# Patient Record
Sex: Male | Born: 1970 | Race: Black or African American | Hispanic: No | Marital: Single | Smoking: Never smoker
Health system: Southern US, Community
[De-identification: ages and names within clinical notes are randomized; demographics above are authoritative.]

## PROBLEM LIST (undated history)

## (undated) DIAGNOSIS — R569 Unspecified convulsions: Secondary | ICD-10-CM

## (undated) DIAGNOSIS — G40909 Epilepsy, unspecified, not intractable, without status epilepticus: Secondary | ICD-10-CM

---

## 2006-07-19 ENCOUNTER — Emergency Department (HOSPITAL_COMMUNITY): Admission: EM | Admit: 2006-07-19 | Discharge: 2006-07-19 | Payer: Self-pay | Admitting: Emergency Medicine

## 2006-12-11 ENCOUNTER — Ambulatory Visit: Payer: Self-pay | Admitting: *Deleted

## 2006-12-11 ENCOUNTER — Ambulatory Visit: Payer: Self-pay | Admitting: Internal Medicine

## 2007-05-11 ENCOUNTER — Emergency Department (HOSPITAL_COMMUNITY): Admission: EM | Admit: 2007-05-11 | Discharge: 2007-05-11 | Payer: Self-pay | Admitting: Emergency Medicine

## 2007-10-03 ENCOUNTER — Encounter (INDEPENDENT_AMBULATORY_CARE_PROVIDER_SITE_OTHER): Payer: Self-pay | Admitting: Nurse Practitioner

## 2007-10-03 ENCOUNTER — Ambulatory Visit: Payer: Self-pay | Admitting: Internal Medicine

## 2007-10-03 LAB — CONVERTED CEMR LAB
Eosinophils Relative: 5 % (ref 0–5)
Glucose, Bld: 86 mg/dL (ref 70–99)
Lymphocytes Relative: 36 % (ref 12–46)
Lymphs Abs: 2.1 10*3/uL (ref 0.7–4.0)
MCHC: 33 g/dL (ref 30.0–36.0)
MCV: 90.9 fL (ref 78.0–100.0)
Monocytes Absolute: 0.9 10*3/uL (ref 0.1–1.0)
Monocytes Relative: 15 % — ABNORMAL HIGH (ref 3–12)
Neutro Abs: 2.6 10*3/uL (ref 1.7–7.7)
Potassium: 4.4 meq/L (ref 3.5–5.3)
RBC: 4.7 M/uL (ref 4.22–5.81)
RDW: 13.7 % (ref 11.5–15.5)
Sodium: 139 meq/L (ref 135–145)
TSH: 0.729 microintl units/mL (ref 0.350–5.50)
Total Protein: 7.4 g/dL (ref 6.0–8.3)
WBC: 6 10*3/uL (ref 4.0–10.5)

## 2007-10-06 ENCOUNTER — Encounter (INDEPENDENT_AMBULATORY_CARE_PROVIDER_SITE_OTHER): Payer: Self-pay | Admitting: Nurse Practitioner

## 2007-10-06 LAB — CONVERTED CEMR LAB
Hep A Total Ab: NEGATIVE
Hep B E Ab: NEGATIVE

## 2008-03-01 ENCOUNTER — Ambulatory Visit: Payer: Self-pay | Admitting: Internal Medicine

## 2008-03-01 LAB — CONVERTED CEMR LAB
AST: 78 units/L — ABNORMAL HIGH (ref 0–37)
Albumin: 4.4 g/dL (ref 3.5–5.2)
BUN: 15 mg/dL (ref 6–23)
CO2: 24 meq/L (ref 19–32)
Calcium: 9.5 mg/dL (ref 8.4–10.5)
Carbamazepine Lvl: 3.7 ug/mL — ABNORMAL LOW (ref 4.0–12.0)
Creatinine, Ser: 1.6 mg/dL — ABNORMAL HIGH (ref 0.40–1.50)
Marijuana Metabolite: NEGATIVE
Opiate Screen, Urine: NEGATIVE
Total Bilirubin: 0.7 mg/dL (ref 0.3–1.2)

## 2008-03-25 ENCOUNTER — Ambulatory Visit (HOSPITAL_COMMUNITY): Admission: RE | Admit: 2008-03-25 | Discharge: 2008-03-25 | Payer: Self-pay | Admitting: Internal Medicine

## 2008-07-26 ENCOUNTER — Emergency Department (HOSPITAL_COMMUNITY): Admission: EM | Admit: 2008-07-26 | Discharge: 2008-07-26 | Payer: Self-pay | Admitting: Emergency Medicine

## 2008-10-15 ENCOUNTER — Ambulatory Visit: Payer: Self-pay | Admitting: Family Medicine

## 2008-11-15 ENCOUNTER — Emergency Department (HOSPITAL_COMMUNITY): Admission: EM | Admit: 2008-11-15 | Discharge: 2008-11-15 | Payer: Self-pay | Admitting: Emergency Medicine

## 2009-06-02 ENCOUNTER — Ambulatory Visit: Payer: Self-pay | Admitting: Internal Medicine

## 2009-06-02 LAB — CONVERTED CEMR LAB
BUN: 13 mg/dL (ref 6–23)
Basophils Absolute: 0 10*3/uL (ref 0.0–0.1)
CO2: 25 meq/L (ref 19–32)
Chloride: 102 meq/L (ref 96–112)
Creatinine, Ser: 1.37 mg/dL (ref 0.40–1.50)
Glucose, Bld: 88 mg/dL (ref 70–99)
Hemoglobin: 14.7 g/dL (ref 13.0–17.0)
Lymphocytes Relative: 33 % (ref 12–46)
Lymphs Abs: 1.9 10*3/uL (ref 0.7–4.0)
MCHC: 33.8 g/dL (ref 30.0–36.0)
MCV: 90.1 fL (ref 78.0–100.0)
Neutrophils Relative %: 49 % (ref 43–77)
Platelets: 274 10*3/uL (ref 150–400)
Triglycerides: 48 mg/dL (ref <150)
WBC: 5.7 10*3/uL (ref 4.0–10.5)

## 2009-06-09 ENCOUNTER — Encounter (INDEPENDENT_AMBULATORY_CARE_PROVIDER_SITE_OTHER): Payer: Self-pay | Admitting: *Deleted

## 2010-06-27 NOTE — Letter (Signed)
Summary: *HSN Results Follow up  [facility]  [address]   [place], [place] [address]   Phone: [phone]  Fax: [phone]      06/09/2009   [name] [address] [place], [place]  [address]   Dear  Mr. Jason Flowers,                            ____S.Drinkard,FNP   ____D. Gore,FNP       ____B. McPherson,MD   ____V. Rankins,MD    ____E. Mulberry,MD    ____N. Daphine Deutscher, FNP  __X__D. Reche Dixon, MD    ____K. Philipp Deputy, MD    ____Other     This letter is to inform you that your recent test(s):  _______Pap Smear    ___X____Lab Test     _______X-ray    __X_____ is within acceptable limits  _______ requires a medication change  _______ requires a follow-up lab visit  _______ requires a follow-up visit with your provider   Comments:       _________________________________________________________ If you have any questions, please contact our office                     Sincerely,  Gaylyn Cheers RN [address]

## 2010-07-14 ENCOUNTER — Inpatient Hospital Stay (INDEPENDENT_AMBULATORY_CARE_PROVIDER_SITE_OTHER)
Admission: RE | Admit: 2010-07-14 | Discharge: 2010-07-14 | Disposition: A | Discharge status: Home / Self Care | Payer: Self-pay | Source: Ambulatory Visit | Attending: Family Medicine | Admitting: Family Medicine

## 2010-07-14 ENCOUNTER — Encounter (HOSPITAL_COMMUNITY): Payer: Self-pay | Admitting: Radiology

## 2010-07-14 ENCOUNTER — Emergency Department (HOSPITAL_COMMUNITY): Payer: Self-pay

## 2010-07-14 ENCOUNTER — Emergency Department (HOSPITAL_COMMUNITY)
Admission: EM | Admit: 2010-07-14 | Discharge: 2010-07-14 | Disposition: A | Discharge status: Home / Self Care | Payer: Self-pay | Attending: Emergency Medicine | Admitting: Emergency Medicine

## 2010-07-14 DIAGNOSIS — N289 Disorder of kidney and ureter, unspecified: Secondary | ICD-10-CM | POA: Insufficient documentation

## 2010-07-14 DIAGNOSIS — R404 Transient alteration of awareness: Secondary | ICD-10-CM | POA: Insufficient documentation

## 2010-07-14 DIAGNOSIS — Z79899 Other long term (current) drug therapy: Secondary | ICD-10-CM | POA: Insufficient documentation

## 2010-07-14 DIAGNOSIS — Z9119 Patient's noncompliance with other medical treatment and regimen: Secondary | ICD-10-CM | POA: Insufficient documentation

## 2010-07-14 DIAGNOSIS — R569 Unspecified convulsions: Secondary | ICD-10-CM

## 2010-07-14 DIAGNOSIS — G40909 Epilepsy, unspecified, not intractable, without status epilepticus: Secondary | ICD-10-CM | POA: Insufficient documentation

## 2010-07-14 DIAGNOSIS — F29 Unspecified psychosis not due to a substance or known physiological condition: Secondary | ICD-10-CM | POA: Insufficient documentation

## 2010-07-14 DIAGNOSIS — Z91199 Patient's noncompliance with other medical treatment and regimen due to unspecified reason: Secondary | ICD-10-CM | POA: Insufficient documentation

## 2010-07-14 HISTORY — DX: Epilepsy, unspecified, not intractable, without status epilepticus: G40.909

## 2010-07-14 LAB — URINALYSIS, ROUTINE W REFLEX MICROSCOPIC
Bilirubin Urine: NEGATIVE
Ketones, ur: 15 mg/dL — AB
Nitrite: NEGATIVE
Urine Glucose, Fasting: NEGATIVE mg/dL

## 2010-07-14 LAB — COMPREHENSIVE METABOLIC PANEL
ALT: 21 U/L (ref 0–53)
AST: 36 U/L (ref 0–37)
Albumin: 4 g/dL (ref 3.5–5.2)
Alkaline Phosphatase: 80 U/L (ref 39–117)
Calcium: 9.8 mg/dL (ref 8.4–10.5)
Creatinine, Ser: 1.79 mg/dL — ABNORMAL HIGH (ref 0.4–1.5)
Potassium: 3.8 mEq/L (ref 3.5–5.1)
Total Bilirubin: 0.6 mg/dL (ref 0.3–1.2)

## 2010-07-14 LAB — POCT I-STAT, CHEM 8
BUN: 18 mg/dL (ref 6–23)
Chloride: 106 mEq/L (ref 96–112)
Creatinine, Ser: 1.7 mg/dL — ABNORMAL HIGH (ref 0.4–1.5)
Hemoglobin: 15 g/dL (ref 13.0–17.0)

## 2010-07-14 LAB — CBC
HCT: 40.9 % (ref 39.0–52.0)
Hemoglobin: 14.7 g/dL (ref 13.0–17.0)
MCV: 86.1 fL (ref 78.0–100.0)
RBC: 4.75 MIL/uL (ref 4.22–5.81)
WBC: 6.7 10*3/uL (ref 4.0–10.5)

## 2010-07-14 LAB — RAPID URINE DRUG SCREEN, HOSP PERFORMED
Barbiturates: NOT DETECTED
Benzodiazepines: POSITIVE — AB
Cocaine: NOT DETECTED
Tetrahydrocannabinol: NOT DETECTED

## 2010-07-14 LAB — POCT CARDIAC MARKERS: Troponin i, poc: <0.05 ng/mL (ref 0.00–0.09)

## 2010-07-14 LAB — DIFFERENTIAL
Eosinophils Absolute: 0.2 10*3/uL (ref 0.0–0.7)
Eosinophils Relative: 3 % (ref 0–5)
Lymphs Abs: 2.2 10*3/uL (ref 0.7–4.0)
Monocytes Relative: 12 % (ref 3–12)
Neutro Abs: 3.5 10*3/uL (ref 1.7–7.7)

## 2010-07-14 LAB — CK TOTAL AND CKMB (NOT AT ARMC)
Relative Index: 0.3 (ref 0.0–2.5)
Total CK: 955 U/L — ABNORMAL HIGH (ref 7–232)

## 2010-07-14 LAB — ETHANOL: Alcohol, Ethyl (B): <5 mg/dL (ref 0–10)

## 2010-07-24 LAB — GLUCOSE, CAPILLARY: Glucose-Capillary: 93 mg/dL (ref 70–99)

## 2010-09-04 LAB — URINALYSIS, ROUTINE W REFLEX MICROSCOPIC
Bilirubin Urine: NEGATIVE
Glucose, UA: NEGATIVE mg/dL
Ketones, ur: NEGATIVE mg/dL
Protein, ur: 100 mg/dL — AB
Specific Gravity, Urine: 1.026 (ref 1.005–1.030)

## 2010-09-04 LAB — URINE MICROSCOPIC-ADD ON

## 2010-09-04 LAB — BASIC METABOLIC PANEL
CO2: 29 mEq/L (ref 19–32)
Calcium: 9 mg/dL (ref 8.4–10.5)
GFR calc non Af Amer: 52 mL/min — ABNORMAL LOW (ref >60)

## 2011-03-22 ENCOUNTER — Inpatient Hospital Stay (INDEPENDENT_AMBULATORY_CARE_PROVIDER_SITE_OTHER)
Admission: RE | Admit: 2011-03-22 | Discharge: 2011-03-22 | Disposition: A | Discharge status: Home / Self Care | Payer: Self-pay | Source: Ambulatory Visit | Attending: Family Medicine | Admitting: Family Medicine

## 2011-03-22 DIAGNOSIS — R569 Unspecified convulsions: Secondary | ICD-10-CM

## 2011-06-27 ENCOUNTER — Encounter (HOSPITAL_COMMUNITY): Payer: Self-pay | Admitting: *Deleted

## 2011-06-27 ENCOUNTER — Emergency Department (INDEPENDENT_AMBULATORY_CARE_PROVIDER_SITE_OTHER)
Admission: EM | Admit: 2011-06-27 | Discharge: 2011-06-27 | Disposition: A | Discharge status: Home / Self Care | Payer: Self-pay | Source: Home / Self Care | Attending: Family Medicine | Admitting: Family Medicine

## 2011-06-27 DIAGNOSIS — Z760 Encounter for issue of repeat prescription: Secondary | ICD-10-CM

## 2011-06-27 HISTORY — DX: Unspecified convulsions: R56.9

## 2011-06-27 MED ORDER — CARBAMAZEPINE 200 MG PO TABS
200.0000 mg | ORAL_TABLET | Freq: Two times a day (BID) | ORAL | Status: DC
Start: 2011-06-27 — End: 2014-03-09

## 2011-06-27 NOTE — ED Provider Notes (Signed)
History     CSN: [id number]  Arrival date & time 06/27/11  1346   First MD Initiated Contact with Patient 06/27/11 1442      Chief Complaint  Patient presents with  . Medication Refill    (Consider location/radiation/quality/duration/timing/severity/associated sxs/prior treatment) Patient is a 41 y.o. male presenting with seizures. The history is provided by the patient.  Seizures  This is a chronic problem. Episode onset: has not run out of medicine, just wants some extra. Number of times: no seizure for about 1 yr.    Past Medical History  Diagnosis Date  . Seizure disorder   . Seizures     History reviewed. No pertinent past surgical history.  History reviewed. No pertinent family history.  History  Substance Use Topics  . Smoking status: Not on file  . Smokeless tobacco: Not on file  . Alcohol Use:       Review of Systems  Constitutional: Negative.   Neurological: Positive for seizures.    Allergies  Review of patient's allergies indicates no known allergies.  Home Medications   Current Outpatient Rx  Name Route Sig Dispense Refill  . CARBAMAZEPINE ER 200 MG PO CP12 Oral Take 200 mg by mouth 2 (two) times daily.    Marland Kitchen CARBAMAZEPINE 200 MG PO TABS Oral Take 1 tablet (200 mg total) by mouth 2 (two) times daily. 60 tablet 1    BP 112/73  Pulse 75  Temp(Src) 97.6 F (36.4 C) (Oral)  Resp 18  SpO2 96%  Physical Exam  Nursing note and vitals reviewed. Constitutional: He is oriented to person, place, and time. He appears well-developed and well-nourished.  HENT:  Head: Normocephalic.  Eyes: Pupils are equal, round, and reactive to light.  Cardiovascular: Normal rate, normal heart sounds and intact distal pulses.   Neurological: He is alert and oriented to person, place, and time.  Skin: Skin is warm and dry.  Psychiatric: He has a normal mood and affect.    ED Course  Procedures (including critical care time)  Labs Reviewed - No data to  display No results found.   1. Encounter for medication refill       MDM          Barkley Bruns, MD 06/27/11 [phone]

## 2011-06-27 NOTE — ED Notes (Signed)
Pt  Has  Ran  Out  Of   His      Seizure  meds         Needs refill     -               Also  Has  A  persistant  Cough  As  Well

## 2014-03-09 ENCOUNTER — Encounter (HOSPITAL_COMMUNITY): Payer: Self-pay | Admitting: Emergency Medicine

## 2014-03-09 ENCOUNTER — Emergency Department (HOSPITAL_COMMUNITY)
Admission: EM | Admit: 2014-03-09 | Discharge: 2014-03-09 | Disposition: A | Discharge status: Home / Self Care | Payer: Self-pay | Source: Home / Self Care | Attending: Family Medicine | Admitting: Family Medicine

## 2014-03-09 DIAGNOSIS — B029 Zoster without complications: Secondary | ICD-10-CM

## 2014-03-09 DIAGNOSIS — G40909 Epilepsy, unspecified, not intractable, without status epilepticus: Secondary | ICD-10-CM

## 2014-03-09 MED ORDER — CARBAMAZEPINE 200 MG PO TABS: 200.0000 mg | ORAL_TABLET | Freq: Two times a day (BID) | ORAL | Status: DC

## 2014-03-09 MED ORDER — ACYCLOVIR 800 MG PO TABS: 800.0000 mg | ORAL_TABLET | Freq: Every day | ORAL | Status: DC

## 2014-03-09 NOTE — ED Notes (Signed)
Pt requesting refill on seizure medication.  Pt has been without for 2 wks.   Pt also c/o a blistery rash on the left lower leg calf area.  Hx of chicken pox as a child.

## 2014-03-09 NOTE — Discharge Instructions (Signed)
You have shingles This will pass quickly with acyclovir Please use benadryl and ibuprofen for pain and itching Please restart your seizure medication

## 2014-03-09 NOTE — ED Provider Notes (Signed)
CSN: [id number]     Arrival date & time 03/09/14  [id number] History   First MD Initiated Contact with Patient 03/09/14 0914     Chief Complaint  Patient presents with  . Rash  . Medication Refill   (Consider location/radiation/quality/duration/timing/severity/associated sxs/prior Treatment) HPI  Seizures: last seizure 2 years ago. Described as [place] to violent eruption. Takes carbamazapine. Has not had medicine for 2 wks. Pt states that he was told he needed appt at neuro prior to refill. appt set for 1 mo from now. Denies, HA, palpitations, syncope  L leg rash. Started 4 days ago. Itching. Started along L hip and traveled down leg.    Past Medical History  Diagnosis Date  . Seizure disorder   . Seizures    History reviewed. No pertinent past surgical history. Family History  Problem Relation Age of Onset  . Coronary artery disease Mother   . Cancer Father     pancreatic  . Cancer Sister    History  Substance Use Topics  . Smoking status: Never Smoker   . Smokeless tobacco: Not on file  . Alcohol Use: No    Review of Systems Per HPI with all other pertinent systems negative.   Allergies  Review of patient's allergies indicates no known allergies.  Home Medications   Prior to Admission medications   Medication Sig Start Date End Date Taking? Authorizing Provider  acyclovir (ZOVIRAX) 800 MG tablet Take 1 tablet (800 mg total) by mouth 5 (five) times daily. 03/09/14   Ozella Rocksavid J Merrell, MD  carbamazepine (CARBATROL) 200 MG 12 hr capsule Take 200 mg by mouth 2 (two) times daily.    Historical Provider, MD  carbamazepine (TEGRETOL) 200 MG tablet Take 1 tablet (200 mg total) by mouth 2 (two) times daily. 03/09/14 03/09/15  Ozella Rocksavid J Merrell, MD   BP 119/73  Pulse 70  Temp(Src) 97.4 F (36.3 C) (Oral)  Resp 16  SpO2 100% Physical Exam  Constitutional: He is oriented to person, place, and time. He appears well-developed and well-nourished.  HENT:  Head: Normocephalic  and atraumatic.  Eyes: EOM are normal. Pupils are equal, round, and reactive to light.  Neck: Normal range of motion.  Pulmonary/Chest: Effort normal and breath sounds normal.  Abdominal: Soft.  Musculoskeletal: Normal range of motion.  Neurological: He is alert and oriented to person, place, and time.  Skin:  Erythematous pathcy clear vesicluar rash along the dermatome of S1.   Psychiatric: He has a normal mood and affect. Thought content normal.    ED Course  Procedures (including critical care time) Labs Review Labs Reviewed - No data to display  Imaging Review No results found.   MDM   1. Shingles   2. Seizure disorder    Refill Carbamazepine  F/u Neuro in 4 wks  Acyclovir NSAIDs prn  Precautions given and all questions answered  Shelly Flattenavid Merrell, MD Family Medicine 03/09/2014, 10:29 AM      Ozella Rocksavid J Merrell, MD 03/09/14 1029

## 2014-05-16 ENCOUNTER — Emergency Department (HOSPITAL_COMMUNITY)
Admission: EM | Admit: 2014-05-16 | Discharge: 2014-05-16 | Disposition: A | Discharge status: Home / Self Care | Payer: Self-pay | Attending: Emergency Medicine | Admitting: Emergency Medicine

## 2014-05-16 ENCOUNTER — Encounter (HOSPITAL_COMMUNITY): Payer: Self-pay

## 2014-05-16 DIAGNOSIS — Z79899 Other long term (current) drug therapy: Secondary | ICD-10-CM | POA: Insufficient documentation

## 2014-05-16 DIAGNOSIS — G40909 Epilepsy, unspecified, not intractable, without status epilepticus: Secondary | ICD-10-CM | POA: Insufficient documentation

## 2014-05-16 DIAGNOSIS — R4182 Altered mental status, unspecified: Secondary | ICD-10-CM | POA: Insufficient documentation

## 2014-05-16 DIAGNOSIS — R569 Unspecified convulsions: Secondary | ICD-10-CM

## 2014-05-16 MED ORDER — CARBAMAZEPINE 200 MG PO TABS
200.0000 mg | ORAL_TABLET | Freq: Once | ORAL | Status: AC
  Administered 2014-05-16: 200 mg via ORAL
  Filled 2014-05-16: qty 1

## 2014-05-16 MED ORDER — LORAZEPAM 2 MG/ML IJ SOLN
0.5000 mg | Freq: Once | INTRAMUSCULAR | Status: AC
  Administered 2014-05-16: 0.5 mg via INTRAVENOUS
  Filled 2014-05-16: qty 1

## 2014-05-16 MED ORDER — CARBAMAZEPINE 200 MG PO TABS: 200.0000 mg | ORAL_TABLET | Freq: Two times a day (BID) | ORAL | Status: DC

## 2014-05-16 MED ORDER — SODIUM CHLORIDE 0.9 % IV BOLUS (SEPSIS)
1000.0000 mL | Freq: Once | INTRAVENOUS | Status: AC
  Administered 2014-05-16: 1000 mL via INTRAVENOUS

## 2014-05-16 NOTE — ED Notes (Addendum)
Per EMS, pt from home   Pt has hx of seizures.  Pt has been out of meds 3 days ago.  Seizure at 12 ish  Found on floor in bathroom  Girlfriend heard him fall.. Pt is postictal.  Pt exhibits aggressive behavior when he is coming awake.  Pt given no meds in route.  Vitals: 142/121, 138/92, hr 100, 99% ra

## 2014-05-16 NOTE — ED Notes (Addendum)
Patient has refused vitals, ekg, and cbg. Patient stated that he wants me to leave him alone and get out of his room. This tech has attempted to make contact twice. DR aware of situation.

## 2014-05-16 NOTE — ED Provider Notes (Signed)
CSN: [id number]     Arrival date & time 05/16/14  1340 History   First MD Initiated Contact with Patient 05/16/14 1400     Chief Complaint  Patient presents with  . Seizures     (Consider location/radiation/quality/duration/timing/severity/associated sxs/prior Treatment) HPI Patient's presents from home after a witnessed seizure. Per EMS, the patient was postictal on their arrival, was generally noncooperative in route. Family member right of the history at home. On my exam the patient is repetitive, slightly disoriented, but denying pain, questioning if he had a seizure. He does acknowledge nausea, denies other complaints. He does acknowledge not taking medication regularly.  Past Medical History  Diagnosis Date  . Seizure disorder   . Seizures    History reviewed. No pertinent past surgical history. Family History  Problem Relation Age of Onset  . Coronary artery disease Mother   . Cancer Father     pancreatic  . Cancer Sister    History  Substance Use Topics  . Smoking status: Never Smoker   . Smokeless tobacco: Not on file  . Alcohol Use: No    Review of Systems  Unable to perform ROS: Mental status change      Allergies  Review of patient's allergies indicates no known allergies.  Home Medications   Prior to Admission medications   Medication Sig Start Date End Date Taking? Authorizing Provider  carbamazepine (TEGRETOL) 200 MG tablet Take 1 tablet (200 mg total) by mouth 2 (two) times daily. 03/09/14 03/09/15 Yes Ozella Rocksavid J Merrell, MD  acyclovir (ZOVIRAX) 800 MG tablet Take 1 tablet (800 mg total) by mouth 5 (five) times daily. Patient not taking: Reported on 05/16/2014 03/09/14   Ozella Rocksavid J Merrell, MD   BP 105/56 mmHg  Pulse 89  Temp(Src) 97.7 F (36.5 C) (Oral)  Resp 16  SpO2 98% Physical Exam  Constitutional: He appears well-developed. No distress.  HENT:  Head: Normocephalic and atraumatic.  Eyes: Conjunctivae and EOM are normal.   Cardiovascular: Normal rate and regular rhythm.   Pulmonary/Chest: Effort normal. No stridor. No respiratory distress.  Abdominal: He exhibits no distension.  Musculoskeletal: He exhibits no edema.  Neurological: He is alert.  Patient generally noncooperative for neurologic exam, but is moving all extremity spontaneously, has no facial asymmetry, has clear, brief speech. There is mild disorientation.  Skin: Skin is warm and dry.  Psychiatric: Cognition and memory are impaired.  Nursing note and vitals reviewed.   ED Course  Procedures (including critical care time) A review of the patient's chart demonstrates medication noncompliance, prior presentations for similar events.   Update: The patient's girlfriend is present.  She assists with history of present illness, corroborates that the patient had seizure earlier today, was generally well prior to the event. The patient is now oriented more appropriate. He reiterates that he has not been taking his antiseizure medication. MDM   Patient presents after a seizure. Here the patient awakens after a prolonged postictal phase.  Vital signs remain stable, patient has no neurologic deficits, and given his description of not taking medication regular, etiology for his seizure seems apparent. Patient received initiation of medication, was discharged with new prescriptions in stable condition.  Gerhard Munchobert Lester Platas, MD 05/16/14 [phone]

## 2014-05-16 NOTE — ED Notes (Signed)
Bed: [id number] Expected date:  Expected time:  Means of arrival:  Comments: seziure post ictal

## 2014-05-16 NOTE — Discharge Instructions (Signed)

## 2014-05-16 NOTE — ED Notes (Signed)
Pt is oriented back to baseline. Aggression has cleared.  meds given.

## 2014-07-15 ENCOUNTER — Encounter (HOSPITAL_COMMUNITY): Payer: Self-pay

## 2014-07-15 ENCOUNTER — Emergency Department (HOSPITAL_COMMUNITY)
Admission: EM | Admit: 2014-07-15 | Discharge: 2014-07-15 | Disposition: A | Discharge status: Home / Self Care | Payer: Self-pay | Attending: Emergency Medicine | Admitting: Emergency Medicine

## 2014-07-15 DIAGNOSIS — G40909 Epilepsy, unspecified, not intractable, without status epilepticus: Secondary | ICD-10-CM | POA: Insufficient documentation

## 2014-07-15 MED ORDER — CARBAMAZEPINE 200 MG PO TABS
200.0000 mg | ORAL_TABLET | Freq: Once | ORAL | Status: AC
  Administered 2014-07-15: 200 mg via ORAL
  Filled 2014-07-15: qty 1

## 2014-07-15 MED ORDER — CARBAMAZEPINE 200 MG PO TABS: 200.0000 mg | ORAL_TABLET | Freq: Two times a day (BID) | ORAL | Status: DC

## 2014-07-15 NOTE — Discharge Instructions (Signed)
Seizure, Adult °A seizure is abnormal electrical activity in the brain. Seizures usually last from 30 seconds to 2 minutes. There are various types of seizures. °Before a seizure, you may have a warning sensation (aura) that a seizure is about to occur. An aura may include the following symptoms:  °· Fear or anxiety. °· Nausea. °· Feeling like the room is spinning (vertigo). °· Vision changes, such as seeing flashing lights or spots. °Common symptoms during a seizure include: °· A change in attention or behavior (altered mental status). °· Convulsions with rhythmic jerking movements. °· Drooling. °· Rapid eye movements. °· Grunting. °· Loss of bladder and bowel control. °· Bitter taste in the mouth. °· Tongue biting. °After a seizure, you may feel confused and sleepy. You may also have an injury resulting from convulsions during the seizure. °HOME CARE INSTRUCTIONS  °· If you are given medicines, take them exactly as prescribed by your health care provider. °· Keep all follow-up appointments as directed by your health care provider. °· Do not swim or drive or engage in risky activity during which a seizure could cause further injury to you or others until your health care provider says it is OK. °· Get adequate rest. °· Teach friends and family what to do if you have a seizure. They should: °¨ Lay you on the ground to prevent a fall. °¨ Put a cushion under your head. °¨ Loosen any tight clothing around your neck. °¨ Turn you on your side. If vomiting occurs, this helps keep your airway clear. °¨ Stay with you until you recover. °¨ Know whether or not you need emergency care. °SEEK IMMEDIATE MEDICAL CARE IF: °· The seizure lasts longer than 5 minutes. °· The seizure is severe or you do not wake up immediately after the seizure. °· You have an altered mental status after the seizure. °· You are having more frequent or worsening seizures. °Someone should drive you to the emergency department or call local emergency  services (911 in [place] °MAKE SURE YOU: °· Understand these instructions. °· Will watch your condition. °· Will get help right away if you are not doing well or get worse. °Document Released: 05/11/2000 Document Revised: 03/04/2013 Document Reviewed: 12/24/2012 °ExitCare® Patient Information ©2015 [employer]. This information is not intended to replace advice given to you by your health care provider. Make sure you discuss any questions you have with your health care provider. ° °Emergency Department Resource Guide °1) Find a Doctor and Pay Out of Pocket °Although you won't have to find out who is covered by your insurance plan, it is a good idea to ask around and get recommendations. You will then need to call the office and see if the doctor you have chosen will accept you as a new patient and what types of options they offer for patients who are self-pay. Some doctors offer discounts or will set up payment plans for their patients who do not have insurance, but you will need to ask so you aren't surprised when you get to your appointment. ° °2) Contact Your Local Health Department °Not all health departments have doctors that can see patients for sick visits, but many do, so it is worth a call to see if yours does. If you don't know where your local health department is, you can check in your phone book. The CDC also has a tool to help you locate your state's health department, and many state websites also have listings of all of their   local health departments. ° °3) Find a Walk-in Clinic °If your illness is not likely to be very severe or complicated, you may want to try a walk in clinic. These are popping up all over the country in pharmacies, drugstores, and shopping centers. They're usually staffed by nurse practitioners or physician assistants that have been trained to treat common illnesses and complaints. They're usually fairly quick and inexpensive. However, if you have serious medical issues or  chronic medical problems, these are probably not your best option. ° °No Primary Care Doctor: °- Call [facility] at  832-8000 - they can help you locate a primary care doctor that  accepts your insurance, provides certain services, etc. °- Physician Referral Service- 1-800-533-3463 ° °Chronic Pain Problems: °Organization         Address  Phone   Notes  °[facility]  (336) 297-2271 Patients need to be referred by their primary care doctor.  ° °Medication Assistance: °Organization         Address  Phone   Notes  °[place] [facility] [address] °[place], [place] [address] (336) 641-8030 --Must be a resident of [place] °-- Must have NO insurance coverage whatsoever (no Medicaid/ Medicare, etc.) °-- The pt. MUST have a primary care doctor that directs their care regularly and follows them in the community °  °[facility]  (866) 331-1348   °[employer]  (888) 892-1162   ° °Agencies that provide inexpensive medical care: °Organization         Address  Phone   Notes  °[facility]  (336) 832-8035   °[facility]    (336) 832-7272   °[facility] [address] °[place], [place] [address] (336) 832-4777   °[facility] [address], °[place] (336) 271-4999   °[facility]    (336) 373-0678   °[facility]    (336) 272-1050   °[facility] ° [address], [place] Phone:  (336) 832-4444, Fax:  (336) 832-4440 Hours of Operation:  9 am - 6 pm, M-F.  Also accepts Medicaid/Medicare and self-pay.  °[facility] ° [address], [place] Phone: (336) 832-3150, Fax: (336) 832-3151. Hours of Operation:  8:30 am - 5:30 pm, M-F.  Also accepts Medicaid and self-pay.  °[facility] [place] [address], [place] Phone: (336) 878-6027   °[facility] [address], [place], [place]  (336)723-1848, Ext. 123 Mondays & Thursdays: 7-9 AM.  First 15 patients are seen on a first come, first serve basis. °  ° °Medicaid-accepting [place] Providers: ° °Organization         Address  Phone   Notes  °[facility] [address], [place] (336) 641-2100 Also accepts self-pay patients.  °[facility] [address], [place] ° (336) 856-9996   °[facility] [address], [place] (336) 288-8857   °[facility] [address] [place] [address], [place] (336) 299-7000   °Veita Bland [address], [place]  ° (336) 373-1557 Only accepts [place] Access Medicaid patients after they have their name applied to their card.  ° °Self-Pay (no insurance) in [place]: ° °Organization         Address  Phone   Notes  °Sickle Cell Patients,   [facility] [address], [place] (336) 832-1970   °[facility] [address], [place] (336) 832-4400   °[facility] ° [address], [place] (336) 992-4800   °[facility]/Dr. Osei-Bonsu ° [address] [place] [address], [place] or [address], [place] (336) 841-8500 Phone number for both [place] and [place] locations is the same.  °[facility] [address], [place] (336) 299-0000   °[facility] [address] [place] [address], [place] or [address] (336) 852-7530 °(336) 878-2260   °[facility] [address], [place] (336) 350-1642, phone; (336) 294-5005, fax Sees patients 1st and 3rd Saturday of every month.  Must not qualify for public or private insurance (i.e. Medicaid, Medicare, [place] Health Choice, Veterans' Benefits) • Household income should be no more than 200% of the poverty level •The clinic cannot treat you if you are pregnant or think you are pregnant • Sexually transmitted  diseases are not treated at the clinic.  ° ° °Dental Care: °Organization         Address  Phone  Notes  °[facility] [address], [place] (336) 641-6152 Accepts children up to age 21 who are enrolled in Medicaid or [place] Health Choice; pregnant women with a Medicaid card; and children who have applied for Medicaid or [place] Health Choice, but were declined, whose parents can pay a reduced fee at time of service.  °[facility] [place]  [address], [place] (336) 641-7733 Accepts children up to age 21 who are enrolled in Medicaid or [place] Health Choice; pregnant women with a Medicaid card; and children who have applied for Medicaid or [place] Health Choice, but were declined, whose parents can pay a reduced fee at time of service.  °[facility] ° [address], [place] (336) 641-4533 Patients are seen by appointment only. Walk-ins are not accepted. [facility] will see patients 18 years of age and older. °Monday - Tuesday (8am-5pm) °Most Wednesdays (8:30-5pm) °$30 per visit, cash only  °[facility] ° [address], [place] (336) 641-4533 Patients are seen by appointment only. Walk-ins are not accepted. [facility] will see patients 18 years of age and older. °One Wednesday Evening (Monthly: Volunteer Based).  $30 per visit, cash only  °[facility]  (919) 537-3737 for adults; Children under age 4, call Graduate Pediatric Dentistry at (919) [phone]. Children aged 4-14, please call (919) 537-3737 to request a pediatric application. ° Dental services are provided in all areas of dental care including fillings, crowns and bridges, complete and partial dentures, implants, gum treatment, root canals, and extractions. Preventive care is also provided. Treatment is provided to both adults and children. °Patients are selected via a  lottery and there is often a waiting list. °  °[facility] [address], °[place] ° (336) 763-8833 [email or web address] °  °[facility] [address], [place], [place] (336)723-1848, Ext. 123 Second and Fourth Thursday of each month, opens at 6:30 AM; Clinic ends at 9 AM.  Patients are seen on a first-come first-served basis, and a limited number are seen during each clinic.  ° °[facility] ° [address], [place], [place] (336) 723-7904   Eligibility Requirements °You must   have lived in [place], [place], or [place] counties for at least the last three months. °  You cannot be eligible for state or federal sponsored healthcare insurance, including Veterans Administration, Medicaid, or Medicare. °  You generally cannot be eligible for healthcare insurance through your employer.  °  How to apply: °Eligibility screenings are held every Tuesday and Wednesday afternoon from 1:00 pm until 4:00 pm. You do not need an appointment for the interview!  °[facility] [address], [place], [place] 336-631-2330   °[facility]  336-342-8273   °[facility]  336-703-3100   °[facility]  336-570-6415   ° °Behavioral Health Resources in the Community: °Intensive Outpatient Programs °Organization         Address  Phone  Notes  °[facility] [address], [place], [place] 336-878-6098   °[facility] [address], [place], [place] 336-832-9800   °[facility] [address], [place], [place] ° 336-882-2125   °[facility] [address],  °[place], [place] 1-800-853-5163 or 336-641-4981   °Substance Abuse Resources °Organization         Address  Phone  Notes  °[facility]  336-882-2125   °[facility]  336-784-9470   °[facility]  336-285-9073   °[facility]  336-845-3988   °Residential &  Outpatient Substance Abuse Program  1-800-659-3381   °Psychological Services °Organization         Address  Phone  Notes  ° Health  336- 832-9600   °[facility]  336- 378-7881   °[facility] [address], [place] 1-800-853-5163 or 336-641-4981   ° °Mobile Crisis Teams °Organization         Address  Phone  Notes  °[facility], Mobile Crisis Care Unit  1-877-626-1772   °Assertive °[facility] ° [address] [place], [place] 336-834-9664   °Sharon DeEsch [address] °[place] 336-554-5454   ° °Self-Help/Support Groups °Organization         Address  Phone             Notes  °[facility] - variety of support groups  336- 373-1402 Call for more information  °Narcotics Anonymous (NA), [facility] [address], °[place]  2 meetings at this location  ° °Residential Treatment Programs °Organization         Address  Phone  Notes  °[facility] [address],    °[place]  1-866-801-8205   °[facility] ° [address], [place], [place] 704-293-8524   °[facility] [address], [place] 336-845-3988 Admissions: 8am-3pm M-F  °[facility] [address]    °[place], [place] 336-841-1104   °[facility] [address], [place], [place] 336-379-7146   °[facility] [address]  °[place], [place] 336-285-9073   °[facility] - Intensive Outpatient [address], [place], [place] 336-852-3033   °[facility] [address]  °[place], [place] 1-877-615-2722 or 336-784-9470   °[facility] [address] [place], [place] 336-227-7417 Accepts Medicaid  °[facility] [address]  °[place] 1-800-659-3381 Substance Abuse/Addiction Treatment  ° °[place] Behavioral Health Resources °Organization          Address    Phone  Notes  °[facility]  (888) 581-9988   °Julie Brannon, PhD [address] [place], [place]   (336) 349-5553 or (336) 951-0000   °[facility]   [address] °[place], [place] (336) 349-4454   °[facility] [address], [place], [place] (336) 342-8316 Insurance/Medicaid/sponsorship through [facility]  °[facility] [address]                                    [place], [place] (336) 342-8316 Therapy/tele-psych/case  °[facility] [address]  ° [place], [place] (336) 349-2233    °Dr. Arfeen  (336) 349-4544   °[facility] [place]  [employer] [place] [facility] 1) [address], [place] °2) [address], [place] °3)  [address], [place] (336) 349-3220 °(336) 342-7768 ° °(336) 342-8140   °[place] [facility] (336) 342-1394 or (336) 342-3537 (After Hours)    ° ° °

## 2014-07-15 NOTE — ED Provider Notes (Signed)
CSN: [id number]     Arrival date & time 07/15/14  1114 History   First MD Initiated Contact with Patient 07/15/14 1148     Chief Complaint  Patient presents with  . Seizures     (Consider location/radiation/quality/duration/timing/severity/associated sxs/prior Treatment) HPI   44 year old male who has a 20+ year history of seizure presenting for evaluation of seizure. Patient states that he normally takes Tegretol 200 mg twice daily as seizure preventative medication however due to insurance issues and lack of funding he has ran out of his seizure medication for at least 6 weeks. States his last seizure was December 20. Today while sitting in bed reading a magazine he experienced an aura and knew a seizure was about to start. He lay down in bed and subsequently had a seizure episode.  This is an unwitnessed episode. Once he came to, he contacted his family member and was brought to the ER for further evaluation. At this time patient states he is at his normal baseline he denies tongue biting or urinary or bowel incontinence. He denies having any headache, body aches, chest pain, shortness of breath, abdominal pain, nausea vomiting diarrhea, or rash. He denies using any recreational drugs or alcohol. He attributed his seizure due to increased stress and not getting adequate sleep as well as not being on his medication. He did contact his PCP and is scheduled to follow-up in 2 weeks but does not have any seizure medication at this time. Otherwise patient has no other complaint.  Past Medical History  Diagnosis Date  . Seizure disorder   . Seizures    History reviewed. No pertinent past surgical history. Family History  Problem Relation Age of Onset  . Coronary artery disease Mother   . Cancer Father     pancreatic  . Cancer Sister    History  Substance Use Topics  . Smoking status: Never Smoker   . Smokeless tobacco: Not on file  . Alcohol Use: No    Review of Systems  All other  systems reviewed and are negative.     Allergies  Review of patient's allergies indicates no known allergies.  Home Medications   Prior to Admission medications   Medication Sig Start Date End Date Taking? Authorizing Provider  carbamazepine (TEGRETOL) 200 MG tablet Take 1 tablet (200 mg total) by mouth 2 (two) times daily. 05/16/14 05/16/15 Yes Gerhard Munch, MD   BP 112/72 mmHg  Pulse 63  Temp(Src) 98.1 F (36.7 C) (Oral)  Resp 12  SpO2 100% Physical Exam  Constitutional: He is oriented to person, place, and time. He appears well-developed and well-nourished. No distress.  Well-appearing African-American male, sitting upright, appears to be in no acute distress, nontoxic in appearance.  HENT:  Head: Atraumatic.  Mouth/Throat: Oropharynx is clear and moist.  No tongue biting  Eyes: Conjunctivae are normal.  Neck: Normal range of motion. Neck supple.  No nuchal rigidity  Cardiovascular: Normal rate.   Pulmonary/Chest: Effort normal and breath sounds normal.  Abdominal: Soft. There is no tenderness.  Musculoskeletal: He exhibits no edema or tenderness.  Neurological: He is alert and oriented to person, place, and time. He has normal strength. No cranial nerve deficit or sensory deficit. He displays a negative Romberg sign. Coordination and gait normal. GCS eye subscore is 4. GCS verbal subscore is 5. GCS motor subscore is 6.  Skin: No rash noted.  Psychiatric: He has a normal mood and affect.    ED Course  Procedures (including critical  care time)  Patient with history of seizure who had an unwitnessed seizure episode today without any injury who presents requesting for medication refill because he has ran out of his medication for the past 6 weeks. Seizure likely due to lack of medication and also due to stress.  He is back to his normal baseline, he does have a follow-up appointment with his PCP in 2 weeks which he agrees to follow-up. He will be able to afford his  medication. He is currently taking Tegretol, will give a dose here. Care discussed with Dr. Loretha StaplerWofford.  Labs Review Labs Reviewed - No data to display  Imaging Review No results found.   EKG Interpretation None      MDM   Final diagnoses:  Seizure disorder    BP 107/76 mmHg  Pulse 66  Temp(Src) 98.1 F (36.7 C) (Oral)  Resp 12  SpO2 97%     Fayrene HelperBowie Pierson Vantol, PA-C 07/15/14 1300  Merrie RoofJohn David Wofford III, MD 07/21/14 Rickey [id number]

## 2014-07-15 NOTE — ED Notes (Signed)
Pt presents with report of seizure this morning.  Pt reports he has not taken tegretol in a month.  Pt reports having 3 seizures in a month.

## 2014-07-15 NOTE — ED Notes (Addendum)
Pt comfortable with discharge and follow up instructions. Pt declines wheelchair, escorted to waiting area by this RN. Prescriptions x1. 

## 2014-08-05 ENCOUNTER — Encounter: Payer: Self-pay | Admitting: Internal Medicine

## 2014-08-05 ENCOUNTER — Ambulatory Visit: Payer: Self-pay | Attending: Internal Medicine | Admitting: Internal Medicine

## 2014-08-05 VITALS — BP 116/67 | HR 81 | Temp 98.1°F | Resp 16 | Ht 65.0 in | Wt 198.0 lb

## 2014-08-05 DIAGNOSIS — G40909 Epilepsy, unspecified, not intractable, without status epilepticus: Secondary | ICD-10-CM | POA: Insufficient documentation

## 2014-08-05 MED ORDER — CARBAMAZEPINE 200 MG PO TABS: 200.0000 mg | ORAL_TABLET | Freq: Two times a day (BID) | ORAL | Status: DC

## 2014-08-05 NOTE — Patient Instructions (Signed)

## 2014-08-05 NOTE — Progress Notes (Signed)
Patient ID: Jason Flowers, male   DOB: [birth year], 44 y.o.   MRN: [id number]   [id number]  [id number]  DOB - 10/11/1970  CC:  Chief Complaint  Patient presents with  . Establish Care       HPI: Jason Flowers is a 44 y.o. male here today to establish medical care. He has a 20+ year history of seizure disorder. Patient states that he normally takes Tegretol 200 mg twice daily as seizure preventative medication however due to insurance issues and lack of funding he ran out of his seizure medication for at least 6 weeks. States his last seizure was May 16, 2014 and 07/15/14. He denies tongue biting or urinary or bowel incontinence. He states that he has a strong family history of seizure disorders including child, sister, and cousin.  It has been several years since he has never seen a neurology. Before his seizure he begins to get have vision changes and feels violent. He states that he gets aggressive and has been known to fight and then black out.   Patient has No headache, No chest pain, No abdominal pain - No Nausea, No new weakness tingling or numbness, No Cough - SOB.  No Known Allergies Past Medical History  Diagnosis Date  . Seizure disorder   . Seizures     since child    Current Outpatient Prescriptions on File Prior to Visit  Medication Sig Dispense Refill  . carbamazepine (TEGRETOL) 200 MG tablet Take 1 tablet (200 mg total) by mouth 2 (two) times daily. 60 tablet 0   No current facility-administered medications on file prior to visit.   Family History  Problem Relation Age of Onset  . Coronary artery disease Mother   . Cancer Father     pancreatic  . Cancer Sister    History   Social History  . Marital Status: Single    Spouse Name: N/A  . Number of Children: N/A  . Years of Education: N/A   Occupational History  . Not on file.   Social History Main Topics  . Smoking status: Never Smoker   . Smokeless tobacco: Not on file  . Alcohol Use: No  .  Drug Use: No  . Sexual Activity: Not on file   Other Topics Concern  . Not on file   Social History Narrative    Review of Systems: See HPI   Objective:   Filed Vitals:   08/05/14 1415  BP: 116/67  Pulse: 81  Temp: 98.1 F (36.7 C)  Resp: 16    Physical Exam  Constitutional: He is oriented to person, place, and time.  Eyes: EOM are normal. Pupils are equal, round, and reactive to light.  Neck: Normal range of motion.  Cardiovascular: Normal rate, regular rhythm and normal heart sounds.   Pulmonary/Chest: Effort normal and breath sounds normal.  Abdominal: Soft. Bowel sounds are normal.  Musculoskeletal: Normal range of motion.  Neurological: He is alert and oriented to person, place, and time. No cranial nerve deficit.     Lab Results  Component Value Date   WBC 6.7 07/14/2010   HGB 15.0 07/14/2010   HCT 44.0 07/14/2010   MCV 86.1 07/14/2010   PLT 226 07/14/2010   Lab Results  Component Value Date   CREATININE 1.7* 07/14/2010   BUN 18 07/14/2010   NA 140 07/14/2010   K 3.7 07/14/2010   CL 106 07/14/2010   CO2 22 07/14/2010    No results found for: HGBA1C Lipid Panel  Component Value Date/Time   CHOL 141 06/02/2009 2024   TRIG 48 06/02/2009 2024   HDL 55 06/02/2009 2024   CHOLHDL 2.6 Ratio 06/02/2009 2024   VLDL 10 06/02/2009 2024   LDLCALC 76 06/02/2009 2024       Assessment and plan:   Jason Flowers was seen today for establish care.  Diagnoses and all orders for this visit:  Seizure disorder Orders: -     Ambulatory referral to Neurology.  Patient will need to apply for orange card asap.  -     Refill carbamazepine (TEGRETOL) 200 MG tablet; Take 1 tablet (200 mg total) by mouth 2 (two) times daily.    Return in about 3 months (around 11/05/2014).  The patient was given clear instructions to go to ER or return to medical center if symptoms don't improve, worsen or new problems develop. The patient verbalized understanding. The patient was  told to call to get lab results if they haven't heard anything in the next week.     Jason Flowers, Jason Eschmann, NP-C [facility] [phone] 08/05/2014, 2:20 PM]

## 2014-08-05 NOTE — Progress Notes (Signed)
Establish Care Hx Sz

## 2014-08-23 ENCOUNTER — Encounter: Payer: Self-pay | Admitting: Neurology

## 2014-08-23 ENCOUNTER — Ambulatory Visit (INDEPENDENT_AMBULATORY_CARE_PROVIDER_SITE_OTHER): Payer: Self-pay | Admitting: Neurology

## 2014-08-23 VITALS — BP 106/70 | HR 72 | Resp 14 | Ht 65.0 in | Wt 207.0 lb

## 2014-08-23 DIAGNOSIS — G40909 Epilepsy, unspecified, not intractable, without status epilepticus: Secondary | ICD-10-CM

## 2014-08-23 DIAGNOSIS — R569 Unspecified convulsions: Secondary | ICD-10-CM

## 2014-08-23 NOTE — Progress Notes (Signed)
[facility]  PATIENT: Jason Flowers DOB: [birth year]  REFERRING DOCTOR OR PCP:  [facility] SOURCE: Patient and PCP and ED visits  _________________________________   HISTORICAL  CHIEF COMPLAINT:  Chief Complaint  Patient presents with  . Seizures    Sts. he was first dx. with epilepsy as a baby.  He hasn't seen a neurologist in 15 yrs.  Currently on Carbamazepine  bid.  Sts. last sz. was about 6 weeks ago--probably due to not taking Carbamazepine for one month.  Sts. character of sz. has changed.  Sts. he used to have generalized tonic clonic movements, but now he is violent--with much acting out, yelling, hitting--sts. he doesn't remember any of this behavior./fim    HISTORY OF PRESENT ILLNESS:  I had the pleasure of seeing your patient, Jason Flowers, at [facility] for a seizure disorder.     His seizure started at around age 73 when he was in kindergarten. His first seizure was a generalized tonic-clonic seizure. Initially, he was on phenobarbital and continued on that through his teen years. At some point in his 69s he was switched to Tegretol. He notes that about 10-12 years ago his seizures change. Initially, all of his seizures were generalized tonic-clonic grand mal type seizures. Then about 10 or 12 years ago he began to have a behavioral component to the seizure. Specifically if somewhat sore seizure they would note that he would yawn then have some jerking activity (but not like in past) but then becomes violent. He has sometimes thrown things   He is groggy afterwards. He would have no memory of the entire event. He recently feels that the seizures are fairly well controlled when he takes Tegretol. He can have just 0 or 1 seizures per year. However, he ran out of his medicines about a month ago and he had a more recent seizure. That brought him to the emergency room and then he was followed up in primary care and sent to [place] for  further evaluation.  He notes a much milder intermittent spell as well. He would note when he is reading that sometimes words become blurry and its. Usually if he just rests he will feel better again shortly. There is no jerking activity with these episodes and he does not lose memory of these events.  He just got back on the Tegretol recently. In the past, he was on brand Tegretol and was unable to afford that much of the time. He is able to afford the generic to stay on the medicine.  He notes some difficulties with memory and speech at times. Specifically he feels short-term memory is not as good as it used to be. He sometimes has trouble getting the right words out or mispronouncing words.  He notes that his son had 2 seizures, back-to-back, at age 14 that were reportedly not associated with fever. His sister had several seizures that followed a head injury. If his cousins began to have seizures at a later age and hers are much more frequent than his.   REVIEW OF SYSTEMS: Constitutional: No fevers, chills, sweats, or change in appetite Eyes: No visual changes, double vision, eye pain Ear, nose and throat: No hearing loss, ear pain, nasal congestion, sore throat Cardiovascular: No chest pain, palpitations Respiratory: No shortness of breath at rest or with exertion.   No wheezes GastrointestinaI: No nausea, vomiting, diarrhea, abdominal pain, fecal incontinence Genitourinary: No dysuria, urinary retention or frequency.  No nocturia. Musculoskeletal: No neck  pain, back pain Integumentary: No rash, pruritus, skin lesions Neurological: as above Psychiatric: No depression at this time.  No anxiety Endocrine: No palpitations, diaphoresis, change in appetite, change in weigh or increased thirst Hematologic/Lymphatic: No anemia, purpura, petechiae. Allergic/Immunologic: No itchy/runny eyes, nasal congestion, recent allergic reactions, rashes  ALLERGIES: No Known Allergies  HOME  MEDICATIONS:  Current outpatient prescriptions:  .  carbamazepine (TEGRETOL) 200 MG tablet, Take 1 tablet (200 mg total) by mouth 2 (two) times daily., Disp: 60 tablet, Rfl: 4  PAST MEDICAL HISTORY: Past Medical History  Diagnosis Date  . Seizure disorder   . Seizures     since child     PAST SURGICAL HISTORY: History reviewed. No pertinent past surgical history.  FAMILY HISTORY: Family History  Problem Relation Age of Onset  . Coronary artery disease Mother   . Cancer Father     pancreatic  . Cancer Sister   . Seizures Sister   . Seizures Son     SOCIAL HISTORY:  History   Social History  . Marital Status: Single    Spouse Name: N/A  . Number of Children: N/A  . Years of Education: N/A   Occupational History  . Not on file.   Social History Main Topics  . Smoking status: Never Smoker   . Smokeless tobacco: Not on file  . Alcohol Use: No  . Drug Use: No  . Sexual Activity: Not on file   Other Topics Concern  . Not on file   Social History Narrative     PHYSICAL EXAM  Filed Vitals:   08/23/14 1320  BP: 106/70  Pulse: 72  Resp: 14  Height:  (1.651 m)  Weight: 207 lb (93.895 kg)    Body mass index is 34.45 kg/(m^2).   General: The patient is well-developed and well-nourished and in no acute distress  Eyes:  Funduscopic exam shows normal optic discs and retinal vessels.  Neck: The neck is supple, no carotid bruits are noted.  The neck is nontender.  Cardiovascular: The heart has a regular rate and rhythm with a normal S1 and S2. There were no murmurs, gallops or rubs. Lungs are clear to auscultation.   Neurologic Exam  Mental status: The patient is alert and oriented x 3 at the time of the examination. The patient has apparent normal recent and remote memory, with an apparently normal attention span and concentration ability.   Speech is normal.  Cranial nerves: Extraocular movements are full. Pupils are equal, round, and reactive to  light and accomodation.   Facial symmetry is present. There is good facial sensation to soft touch bilaterally.Facial strength is normal.  Trapezius and sternocleidomastoid strength is normal. No dysarthria is noted.  The tongue is midline, and the patient has symmetric elevation of the soft palate. No obvious hearing deficits are noted.  Motor:  Muscle bulk is normal.   Tone is normal. Strength is  5 / 5 in all 4 extremities.   Sensory: Sensory testing is intact to soft touch in all 4 extremities.  Coordination: Cerebellar testing reveals good finger-nose-finger bilaterally.  Gait and station: Station is normal.   Gait is normal. Tandem gait is normal. Romberg is negative.   Reflexes: Deep tendon reflexes are symmetric and normal bilaterally.     DIAGNOSTIC DATA (LABS, IMAGING, TESTING) - I reviewed patient records, labs, notes, testing and imaging myself where available.     ASSESSMENT AND PLAN  Convulsions, unspecified convulsion type - Plan: Carbamazepine level, total,  EEG adult  Seizure disorder   In summary, Jason Flowers is a 44 year old man with a long history of seizures. Recent seizure was most likely related to him stopping his Tegretol. He is back on medication and I will check a level to make sure that he is therapeutic. His seizures are unusual in that he apparently had more typical generalized tonic clonic seizures as a child to use but over the last 10 years they have changed to more behavioral component. It is unclear if the behavioral component is ictal or postictal. This could represent partial complex seizures.   I will check an EEG. If asymmetry is noted, we will go ahead and check an MRI of the brain to make sure there is not epileptogenic focus.  He will return to see me in 6 months or sooner if he has new or worsening symptoms.    Richard A. Epimenio FootSater, MD, PhD 08/23/2014, 1:46 PM Certified in Neurology, Clinical Neurophysiology, Sleep Medicine, Pain Medicine and  Neuroimaging  [facility] [address], [address] [place], [place] [id number] [phone]

## 2014-08-24 ENCOUNTER — Telehealth: Payer: Self-pay | Admitting: *Deleted

## 2014-08-24 LAB — CARBAMAZEPINE LEVEL, TOTAL: CARBAMAZEPINE LVL: 6.9 ug/mL (ref 4.0–12.0)

## 2014-08-24 NOTE — Telephone Encounter (Signed)
Spoke with Jason Flowers and per RAS, advised Tegretol level is therapeutic; he should continue current dose.  He verbalized understanding of same/fim

## 2014-08-24 NOTE — Telephone Encounter (Signed)
-----   Message from Asa Lenteichard A Sater, MD sent at 08/24/2014  8:38 AM EDT ----- carbamezapine (tegretol) level is good, stay on current dose

## 2014-08-24 NOTE — Telephone Encounter (Signed)
Spoke with Jason Flowers and per RAS advised that Tegretol level is therapeutic--he should continue Tegretol as rx'd, at current dose.  Jason Flowers verbalized understanding of same/fim

## 2014-08-24 NOTE — Telephone Encounter (Signed)
-----   Message from Asa Lenteichard A Sater, MD sent at 08/24/2014  9:49 AM EDT ----- Tegretol level is good   --- continue current dose

## 2014-09-02 ENCOUNTER — Ambulatory Visit (INDEPENDENT_AMBULATORY_CARE_PROVIDER_SITE_OTHER): Payer: Self-pay | Admitting: Neurology

## 2014-09-02 DIAGNOSIS — R569 Unspecified convulsions: Secondary | ICD-10-CM

## 2014-09-02 NOTE — Procedures (Signed)
    History:  Jason FeathersLarry Flowers is a 44 year old patient with a history of seizures since age 89. The patient has generalized seizures, but more recently he has had some behavior alteration with some agitation, and violence. The patient has noted good control of the seizures on Tegretol. The patient being evaluated for the seizure events.  This is a routine EEG. No skull defects are noted. Medications include carbamazepine.   EEG classification: Normal awake and asleep  Description of the recording: The background rhythms of this recording consists of a fairly well modulated medium amplitude background activity of 9 Hz. As the record progresses, the patient initially is in the waking state, but appears to enter the early stage II sleep during the recording, with rudimentary sleep spindles and vertex sharp wave activity seen. During the wakeful state, photic stimulation is performed, and this results in a bilateral and symmetric photic driving response. Hyperventilation was also performed, and this results in a minimal buildup of the background rhythm activities without significant slowing seen. At no time during the recording does there appear to be evidence of spike or spike wave discharges or evidence of focal slowing. EKG monitor shows no evidence of cardiac rhythm abnormalities with a heart rate of 72.  Impression: This is a normal EEG recording in the waking and sleeping state. No evidence of ictal or interictal discharges were seen at any time during the recording.

## 2014-09-03 ENCOUNTER — Telehealth: Payer: Self-pay | Admitting: *Deleted

## 2014-09-03 NOTE — Telephone Encounter (Signed)
-----   Message from Asa Lenteichard A Sater, MD sent at 09/02/2014  3:44 PM EDT ----- Please let him know the EEG looks good so continue on the Tegretol

## 2014-09-03 NOTE — Telephone Encounter (Signed)
-----   Message from Richard A Sater, MD sent at 09/02/2014  3:44 PM EDT ----- Please let him know the EEG looks good so continue on the Tegretol 

## 2014-09-03 NOTE — Telephone Encounter (Signed)
Attempted to contact [name].  No answer, no ans. machine/fim

## 2014-09-03 NOTE — Telephone Encounter (Signed)
Spoke with Jason Flowers and per RAS, advised EEG was ok; continue Tegretol as rx'd.  He verbalized understanding of same/fim

## 2014-09-03 NOTE — Telephone Encounter (Signed)
Patient returning Faith, RN call.  Please return call to [phone].

## 2014-09-03 NOTE — Telephone Encounter (Signed)
Spoke with [name] and per RAS, advised that EEG was normal--he should continue Tegretol as rx'd.  He verbalized understanding of same./fim

## 2014-10-10 ENCOUNTER — Emergency Department (HOSPITAL_COMMUNITY): Payer: Self-pay

## 2014-10-10 ENCOUNTER — Encounter (HOSPITAL_COMMUNITY): Payer: Self-pay | Admitting: Emergency Medicine

## 2014-10-10 ENCOUNTER — Emergency Department (HOSPITAL_COMMUNITY)
Admission: EM | Admit: 2014-10-10 | Discharge: 2014-10-10 | Disposition: A | Discharge status: Home / Self Care | Payer: Self-pay | Attending: Emergency Medicine | Admitting: Emergency Medicine

## 2014-10-10 DIAGNOSIS — R079 Chest pain, unspecified: Secondary | ICD-10-CM

## 2014-10-10 DIAGNOSIS — R42 Dizziness and giddiness: Secondary | ICD-10-CM | POA: Insufficient documentation

## 2014-10-10 DIAGNOSIS — R0789 Other chest pain: Secondary | ICD-10-CM | POA: Insufficient documentation

## 2014-10-10 DIAGNOSIS — M549 Dorsalgia, unspecified: Secondary | ICD-10-CM | POA: Insufficient documentation

## 2014-10-10 DIAGNOSIS — R251 Tremor, unspecified: Secondary | ICD-10-CM | POA: Insufficient documentation

## 2014-10-10 DIAGNOSIS — R109 Unspecified abdominal pain: Secondary | ICD-10-CM | POA: Insufficient documentation

## 2014-10-10 DIAGNOSIS — G40909 Epilepsy, unspecified, not intractable, without status epilepticus: Secondary | ICD-10-CM | POA: Insufficient documentation

## 2014-10-10 LAB — BASIC METABOLIC PANEL
Anion gap: 8 (ref 5–15)
BUN: 18 mg/dL (ref 6–20)
CO2: 28 mmol/L (ref 22–32)
Calcium: 9.3 mg/dL (ref 8.9–10.3)
Chloride: 103 mmol/L (ref 101–111)
Creatinine, Ser: 1.55 mg/dL — ABNORMAL HIGH (ref 0.61–1.24)
GFR calc Af Amer: >60 mL/min (ref >60)
GFR calc non Af Amer: 53 mL/min — ABNORMAL LOW (ref >60)
Glucose, Bld: 96 mg/dL (ref 65–99)
Potassium: 4.1 mmol/L (ref 3.5–5.1)
Sodium: 139 mmol/L (ref 135–145)

## 2014-10-10 LAB — CBC
HCT: 43.9 % (ref 39.0–52.0)
Hemoglobin: 15 g/dL (ref 13.0–17.0)
MCH: 30.5 pg (ref 26.0–34.0)
MCHC: 34.2 g/dL (ref 30.0–36.0)
MCV: 89.2 fL (ref 78.0–100.0)
Platelets: 246 10*3/uL (ref 150–400)
RBC: 4.92 MIL/uL (ref 4.22–5.81)
RDW: 12.3 % (ref 11.5–15.5)
WBC: 6 10*3/uL (ref 4.0–10.5)

## 2014-10-10 LAB — I-STAT TROPONIN, ED: Troponin i, poc: 0 ng/mL (ref 0.00–0.08)

## 2014-10-10 MED ORDER — SODIUM CHLORIDE 0.9 % IV BOLUS (SEPSIS)
1000.0000 mL | Freq: Once | INTRAVENOUS | Status: AC
  Administered 2014-10-10: 1000 mL via INTRAVENOUS

## 2014-10-10 MED ORDER — IOHEXOL 350 MG/ML SOLN
100.0000 mL | Freq: Once | INTRAVENOUS | Status: AC | PRN
  Administered 2014-10-10: 100 mL via INTRAVENOUS

## 2014-10-10 NOTE — ED Notes (Signed)
EDPA  TATYANNA at bedside.

## 2014-10-10 NOTE — ED Notes (Signed)
Pt c/o burning pain to left back that moved to chest, pt states it last 10 mins but has went away. Denies n/v or SOB.

## 2014-10-10 NOTE — ED Provider Notes (Signed)
Medical screening examination/treatment/procedure(s) were performed by non-physician practitioner and as supervising physician I was immediately available for consultation/collaboration.   EKG Interpretation None      EKG:  Rhythm: sinus bradycardia Rate: 57 PR: 127 ms QRS: 86 ms QTc: 494 ms ST segments: NS ST changes   Raeford RazorStephen Kimberleigh Mehan, MD 10/10/14 1540

## 2014-10-10 NOTE — ED Notes (Signed)
Patient transported to CT 

## 2014-10-10 NOTE — Discharge Instructions (Signed)
Avoid strenuous physical activity. If symptoms continue, follow up with primary care doctor or return to ER.    Chest Pain (Nonspecific) It is often hard to give a specific diagnosis for the cause of chest pain. There is always a chance that your pain could be related to something serious, such as a heart attack or a blood clot in the lungs. You need to follow up with your health care provider for further evaluation. CAUSES   Heartburn.  Pneumonia or bronchitis.  Anxiety or stress.  Inflammation around your heart (pericarditis) or lung (pleuritis or pleurisy).  A blood clot in the lung.  A collapsed lung (pneumothorax). It can develop suddenly on its own (spontaneous pneumothorax) or from trauma to the chest.  Shingles infection (herpes zoster virus). The chest wall is composed of bones, muscles, and cartilage. Any of these can be the source of the pain.  The bones can be bruised by injury.  The muscles or cartilage can be strained by coughing or overwork.  The cartilage can be affected by inflammation and become sore (costochondritis). DIAGNOSIS  Lab tests or other studies may be needed to find the cause of your pain. Your health care provider may have you take a test called an ambulatory electrocardiogram (ECG). An ECG records your heartbeat patterns over a 24-hour period. You may also have other tests, such as:  Transthoracic echocardiogram (TTE). During echocardiography, sound waves are used to evaluate how blood flows through your heart.  Transesophageal echocardiogram (TEE).  Cardiac monitoring. This allows your health care provider to monitor your heart rate and rhythm in real time.  Holter monitor. This is a portable device that records your heartbeat and can help diagnose heart arrhythmias. It allows your health care provider to track your heart activity for several days, if needed.  Stress tests by exercise or by giving medicine that makes the heart beat  faster. TREATMENT   Treatment depends on what may be causing your chest pain. Treatment may include:  Acid blockers for heartburn.  Anti-inflammatory medicine.  Pain medicine for inflammatory conditions.  Antibiotics if an infection is present.  You may be advised to change lifestyle habits. This includes stopping smoking and avoiding alcohol, caffeine, and chocolate.  You may be advised to keep your head raised (elevated) when sleeping. This reduces the chance of acid going backward from your stomach into your esophagus. Most of the time, nonspecific chest pain will improve within 2-3 days with rest and mild pain medicine.  HOME CARE INSTRUCTIONS   If antibiotics were prescribed, take them as directed. Finish them even if you start to feel better.  For the next few days, avoid physical activities that bring on chest pain. Continue physical activities as directed.  Do not use any tobacco products, including cigarettes, chewing tobacco, or electronic cigarettes.  Avoid drinking alcohol.  Only take medicine as directed by your health care provider.  Follow your health care provider's suggestions for further testing if your chest pain does not go away.  Keep any follow-up appointments you made. If you do not go to an appointment, you could develop lasting (chronic) problems with pain. If there is any problem keeping an appointment, call to reschedule. SEEK MEDICAL CARE IF:   Your chest pain does not go away, even after treatment.  You have a rash with blisters on your chest.  You have a fever. SEEK IMMEDIATE MEDICAL CARE IF:   You have increased chest pain or pain that spreads to your arm,  neck, jaw, back, or abdomen.  You have shortness of breath.  You have an increasing cough, or you cough up blood.  You have severe back or abdominal pain.  You feel nauseous or vomit.  You have severe weakness.  You faint.  You have chills. This is an emergency. Do not wait to  see if the pain will go away. Get medical help at once. Call your local emergency services (911 in [place] Do not drive yourself to the hospital. MAKE SURE YOU:   Understand these instructions.  Will watch your condition.  Will get help right away if you are not doing well or get worse. Document Released: 02/21/2005 Document Revised: 05/19/2013 Document Reviewed: 12/18/2007 [facility] Patient Information 2015 [place], [place]. This information is not intended to replace advice given to you by your health care provider. Make sure you discuss any questions you have with your health care provider.

## 2014-10-10 NOTE — ED Provider Notes (Signed)
CSN: [id number]     Arrival date & time 10/10/14  1322 History   First MD Initiated Contact with Patient 10/10/14 1507     Chief Complaint  Patient presents with  . Chest Pain     (Consider location/radiation/quality/duration/timing/severity/associated sxs/prior Treatment) HPI Jason Flowers is a 44 y.o. male with hx of seizure who presents to emergency department complaining of pain in his left shoulder, abdomen, shakiness in the right hand. 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 He states he also started feeling pain in the epigastric area. States he could not get up because of the pain. He admits to feeling dizzy. He denies shortness of breath. Denies near-syncope. He states symptoms lasted approximately 10 minutes and resolved. Patient became concerned and came to emergency department. He denies any medical problems other than seizures. He denies any prior cardiac problems. He does not smoke or drink. He admits to taking some over-the-counter supplements for body building. He does not have a primary care doctor.   Past Medical History  Diagnosis Date  . Seizure disorder   . Seizures     since child    History reviewed. No pertinent past surgical history. Family History  Problem Relation Age of Onset  . Coronary artery disease Mother   . Cancer Father     pancreatic  . Cancer Sister   . Seizures Sister   . Seizures Son    History  Substance Use Topics  . Smoking status: Never Smoker   . Smokeless tobacco: Not on file  . Alcohol Use: No    Review of Systems  Constitutional: Negative for fever and chills.  Respiratory: Negative for cough, chest tightness and shortness of breath.   Cardiovascular: Positive for chest pain. Negative for palpitations and leg swelling.  Gastrointestinal: Positive for abdominal pain. Negative for nausea, vomiting,  diarrhea and abdominal distention.  Genitourinary: Negative for dysuria, urgency, frequency and hematuria.  Musculoskeletal: Positive for back pain. Negative for myalgias, arthralgias, neck pain and neck stiffness.  Skin: Negative for rash.  Allergic/Immunologic: Negative for immunocompromised state.  Neurological: Positive for dizziness, tremors and light-headedness. Negative for weakness, numbness and headaches.      Allergies  Review of patient's allergies indicates no known allergies.  Home Medications   Prior to Admission medications   Medication Sig Start Date End Date Taking? Authorizing Provider  carbamazepine (TEGRETOL) 200 MG tablet Take 1 tablet (200 mg total) by mouth 2 (two) times daily. 08/05/14 08/05/15 Yes Ambrose FinlandValerie A Keck, NP   BP 102/52 mmHg  Pulse 64  Resp 16  SpO2 100% Physical Exam  Constitutional: He appears well-developed and well-nourished. No distress.  HENT:  Head: Normocephalic and atraumatic.  Eyes: Conjunctivae are normal.  Neck: Neck supple.  Cardiovascular: Normal rate, regular rhythm, normal heart sounds and intact distal pulses.   Pulmonary/Chest: Effort normal. No respiratory distress. He has no wheezes. He has no rales.  Abdominal: Soft. Bowel sounds are normal. He exhibits no distension. There is no tenderness. There is no rebound.  Musculoskeletal: He exhibits no edema.  Distal radial pulses intact  Neurological: He is alert.  Skin: Skin is warm and dry.  Nursing note and vitals reviewed.   ED Course  Procedures (including critical care time) Labs Review Labs Reviewed  BASIC METABOLIC PANEL - Abnormal; Notable for the following:    Creatinine, Ser 1.55 (*)  GFR calc non Af Amer 53 (*)    All other components within normal limits  CBC  I-STAT TROPOININ, ED    Imaging Review Dg Chest 2 View  10/10/2014   CLINICAL DATA:  Left upper chest pain today.  EXAM: CHEST  2 VIEW  COMPARISON:  July 19, 2006  FINDINGS: The heart size and  mediastinal contours are within normal limits. There is no focal infiltrate, pulmonary edema, or pleural effusion. The visualized skeletal structures are unremarkable.  IMPRESSION: No active cardiopulmonary disease.   Electronically Signed   By: Sherian ReinWei-Chen  Lin M.D.   On: 10/10/2014 14:02   Ct Angio Chest Aorta W/cm &/or Wo/cm  10/10/2014   CLINICAL DATA:  Chest pain.  EXAM: CT ANGIOGRAPHY CHEST WITH CONTRAST  TECHNIQUE: Multidetector CT imaging of the chest was performed using the standard protocol during bolus administration of intravenous contrast. Multiplanar CT image reconstructions and MIPs were obtained to evaluate the vascular anatomy.  CONTRAST:  100mL OMNIPAQUE IOHEXOL 350 MG/ML SOLN  COMPARISON:  Chest x-ray dated 10/10/2014  FINDINGS: The thoracic aorta appears normal. No evidence of aortic dissection or aneurysm. There are no coronary artery or aortic calcifications. Heart size is normal. No pericardial effusion. The lungs are clear except for some minimal dependent atelectasis at the lung bases. No effusions. No osseous abnormality of significance. The visualized portion of the upper abdomen is normal. Opacification of the pulmonary arteries is not adequate for evaluation of for pulmonary emboli.  Review of the MIP images confirms the above findings.  IMPRESSION: Normal is CT angiogram of the chest. Normal thoracic aorta. Normal appearing heart. Pulmonary arteries are not adequately opacified to evaluate for pulmonary emboli.   Electronically Signed   By: Francene BoyersJames  Maxwell M.D.   On: 10/10/2014 16:59     EKG Interpretation None      MDM   Final diagnoses:  Chest pain   Pt with sharp severe pain in left shoulder, scapula, epigastric abdominal pain. Symptoms now resolved. Normal labs and CXR. Discussed with Dr. Juleen ChinaKohut, concernng for possible dissection. Will get CT angio. Pt in NAD. VS normal.   Filed Vitals:   10/10/14 1327  BP: 102/52  Pulse: 64  Resp: 16   6:02 PM CT and is  negative. Patient continues to be hemodynamically stable and pain-free. Pulmonary embolism was not able to develop a CT scan, however have a low suspicion for that. He denies any chest pain, shortness of breath, he is PERC negative. Question nerve impingement that would cause this type of pain. We'll discharge home with over-the-counter pain medications as needed. Follow-up with primary care doctor or return if symptoms worsening.  Filed Vitals:   10/10/14 1651  BP: 98/60  Pulse: 63  Temp: 97.5 F (36.4 C)  Resp: [address]      Evetta Renner, PA-C 10/10/14 1803  Raeford RazorStephen Kohut, MD 10/13/14 1233

## 2015-02-15 ENCOUNTER — Ambulatory Visit: Payer: Self-pay | Admitting: Neurology

## 2015-02-16 ENCOUNTER — Encounter: Payer: Self-pay | Admitting: Neurology

## 2015-05-04 ENCOUNTER — Telehealth: Payer: Self-pay | Admitting: Internal Medicine

## 2015-05-04 DIAGNOSIS — G40909 Epilepsy, unspecified, not intractable, without status epilepticus: Secondary | ICD-10-CM

## 2015-05-04 NOTE — Telephone Encounter (Signed)
Pt. Called requesting a med refill on carbamazepine (TEGRETOL) 200 MG tablet. Pt. Was giving an appt. On 05/10/15. Pt. Stated he is out of his med. Please f/u with pt.

## 2015-05-05 ENCOUNTER — Other Ambulatory Visit: Payer: Self-pay | Admitting: Internal Medicine

## 2015-05-05 ENCOUNTER — Telehealth: Payer: Self-pay

## 2015-05-05 MED ORDER — CARBAMAZEPINE 200 MG PO TABS: 200.0000 mg | ORAL_TABLET | Freq: Two times a day (BID) | ORAL | Status: DC

## 2015-05-05 NOTE — Telephone Encounter (Signed)
Spoke with patient and [facility]

## 2015-05-10 ENCOUNTER — Ambulatory Visit: Payer: Self-pay | Admitting: Internal Medicine

## 2015-05-25 ENCOUNTER — Encounter: Payer: Self-pay | Admitting: Internal Medicine

## 2015-05-25 ENCOUNTER — Ambulatory Visit: Payer: Self-pay | Attending: Internal Medicine | Admitting: Internal Medicine

## 2015-05-25 VITALS — BP 108/63 | HR 71 | Temp 98.0°F | Resp 16 | Ht 65.0 in | Wt 202.0 lb

## 2015-05-25 DIAGNOSIS — G40909 Epilepsy, unspecified, not intractable, without status epilepticus: Secondary | ICD-10-CM

## 2015-05-25 MED ORDER — CARBAMAZEPINE 200 MG PO TABS: 200.0000 mg | ORAL_TABLET | Freq: Two times a day (BID) | ORAL | Status: DC

## 2015-05-25 NOTE — Progress Notes (Signed)
Patient here for follow up on his epilepsy and for medication refill

## 2015-05-25 NOTE — Progress Notes (Signed)
Patient ID: Jason Flowers, male   DOB: [birth year], 44 y.o.   MRN: [id number]  CC: seizure disorder  HPI: Jason Flowers is a 44 y.o. male here today for a follow up visit.  Patient has past medical history of seizure disorder. Patient was seen by me earlier this year and was given a refill of his Tegretol. He was later referred to Neurology because he reported changes in seizure activity and behavior. He was sent for a EEG which returned normal. He last seen Neurology in March. Today he reports that he needs a medication refill. His last reported seizure was earlier this year. He states that he has been having issues with memory issues. He has been out of Tegretol for over 2 weeks.   Patient has No headache, No chest pain, No abdominal pain - No Nausea, No new weakness tingling or numbness, No Cough - SOB.  No Known Allergies Past Medical History  Diagnosis Date  . Seizure disorder (HCC)   . Seizures (HCC)     since child    Current Outpatient Prescriptions on File Prior to Visit  Medication Sig Dispense Refill  . carbamazepine (TEGRETOL) 200 MG tablet Take 1 tablet (200 mg total) by mouth 2 (two) times daily. 60 tablet 0   No current facility-administered medications on file prior to visit.   Family History  Problem Relation Age of Onset  . Coronary artery disease Mother   . Cancer Father     pancreatic  . Cancer Sister   . Seizures Sister   . Seizures Son    Social History   Social History  . Marital Status: Single    Spouse Name: N/A  . Number of Children: N/A  . Years of Education: N/A   Occupational History  . Not on file.   Social History Main Topics  . Smoking status: Never Smoker   . Smokeless tobacco: Not on file  . Alcohol Use: No  . Drug Use: No  . Sexual Activity: Not on file   Other Topics Concern  . Not on file   Social History Narrative    Review of Systems: Other than what is stated in HPI, all other systems are negative.   Objective:   Filed  Vitals:   05/25/15 1410  BP: 108/63  Pulse: 71  Temp: 98 F (36.7 C)  Resp: 16    Physical Exam  Constitutional: He is oriented to person, place, and time.  Cardiovascular: Normal rate, regular rhythm and normal heart sounds.   Pulmonary/Chest: Effort normal and breath sounds normal.  Neurological: He is alert and oriented to person, place, and time. No cranial nerve deficit.  Skin: Skin is warm and dry.  Psychiatric: He has a normal mood and affect.     Lab Results  Component Value Date   WBC 6.0 10/10/2014   HGB 15.0 10/10/2014   HCT 43.9 10/10/2014   MCV 89.2 10/10/2014   PLT 246 10/10/2014   Lab Results  Component Value Date   CREATININE 1.55* 10/10/2014   BUN 18 10/10/2014   NA 139 10/10/2014   K 4.1 10/10/2014   CL 103 10/10/2014   CO2 28 10/10/2014    No results found for: HGBA1C Lipid Panel     Component Value Date/Time   CHOL 141 06/02/2009 2024   TRIG 48 06/02/2009 2024   HDL 55 06/02/2009 2024   CHOLHDL 2.6 Ratio 06/02/2009 2024   VLDL 10 06/02/2009 2024   LDLCALC 76 06/02/2009 2024  Assessment and plan:   Jason Flowers was seen today for follow-up.  Diagnoses and all orders for this visit:  Seizure disorder (HCC) -    Refilled carbamazepine (TEGRETOL) 200 MG tablet; Take 1 tablet (200 mg total) by mouth 2 (two) times daily. Patient will need to renew [employer] financial so that he can go back to Neurology  Return in about 6 months (around 11/23/2015) for routine.       Jason Finland, NP-C [facility] [phone] 05/25/2015, 2:15 PM

## 2015-05-25 NOTE — Patient Instructions (Signed)
Please apply for financial program so that you can reschedule appointment with Neurology.

## 2015-07-26 MED FILL — carBAMazepine 200 MG TABS: 200 | 30 days supply | Qty: 60 | Fill #0

## 2015-10-04 MED FILL — carBAMazepine 200 MG TABS: 200 | 30 days supply | Qty: 60 | Fill #1

## 2015-12-23 ENCOUNTER — Other Ambulatory Visit: Payer: Self-pay | Admitting: Internal Medicine

## 2015-12-23 DIAGNOSIS — G40909 Epilepsy, unspecified, not intractable, without status epilepticus: Secondary | ICD-10-CM

## 2016-01-20 ENCOUNTER — Telehealth: Payer: Self-pay | Admitting: Internal Medicine

## 2016-01-20 DIAGNOSIS — G40909 Epilepsy, unspecified, not intractable, without status epilepticus: Secondary | ICD-10-CM

## 2016-01-20 NOTE — Telephone Encounter (Signed)
Pt called the office to speak with nurse regarding his medication. Patient needs a refill for carbamazepine (TEGRETOL) 200 MG tablet. Pt tried to schedule an appointment to establish with new provider but there's no appointment available. Pt will call us on Monday to schedule. Please follow up.  Thank you

## 2016-01-23 MED ORDER — CARBAMAZEPINE 200 MG PO TABS: 200.0000 mg | ORAL_TABLET | Freq: Two times a day (BID) | ORAL | 0 refills | Status: DC

## 2016-01-23 NOTE — Telephone Encounter (Signed)
Carbamazepine refilled but patient must have office visit for further refills.

## 2016-01-27 ENCOUNTER — Encounter (HOSPITAL_COMMUNITY): Payer: Self-pay | Admitting: Emergency Medicine

## 2016-01-27 ENCOUNTER — Emergency Department (HOSPITAL_COMMUNITY)
Admission: EM | Admit: 2016-01-27 | Discharge: 2016-01-28 | Disposition: A | Discharge status: Home / Self Care | Payer: Self-pay | Attending: Emergency Medicine | Admitting: Emergency Medicine

## 2016-01-27 DIAGNOSIS — Y9289 Other specified places as the place of occurrence of the external cause: Secondary | ICD-10-CM | POA: Insufficient documentation

## 2016-01-27 DIAGNOSIS — G40909 Epilepsy, unspecified, not intractable, without status epilepticus: Secondary | ICD-10-CM | POA: Insufficient documentation

## 2016-01-27 DIAGNOSIS — R569 Unspecified convulsions: Secondary | ICD-10-CM

## 2016-01-27 DIAGNOSIS — S0121XA Laceration without foreign body of nose, initial encounter: Secondary | ICD-10-CM | POA: Insufficient documentation

## 2016-01-27 DIAGNOSIS — Y9389 Activity, other specified: Secondary | ICD-10-CM | POA: Insufficient documentation

## 2016-01-27 DIAGNOSIS — W2203XA Walked into furniture, initial encounter: Secondary | ICD-10-CM | POA: Insufficient documentation

## 2016-01-27 DIAGNOSIS — T421X6A Underdosing of iminostilbenes, initial encounter: Secondary | ICD-10-CM | POA: Insufficient documentation

## 2016-01-27 DIAGNOSIS — Y999 Unspecified external cause status: Secondary | ICD-10-CM | POA: Insufficient documentation

## 2016-01-27 LAB — CBG MONITORING, ED: GLUCOSE-CAPILLARY: 72 mg/dL (ref 65–99)

## 2016-01-27 MED ORDER — CARBAMAZEPINE 200 MG PO TABS: 200.0000 mg | ORAL_TABLET | Freq: Two times a day (BID) | ORAL | 0 refills | Status: DC

## 2016-01-27 MED ORDER — SODIUM CHLORIDE 0.9 % IV BOLUS (SEPSIS)
1000.0000 mL | Freq: Once | INTRAVENOUS | Status: AC
  Administered 2016-01-27: 1000 mL via INTRAVENOUS

## 2016-01-27 MED ORDER — ACETAMINOPHEN 500 MG PO TABS
1000.0000 mg | ORAL_TABLET | ORAL | Status: DC | PRN
  Administered 2016-01-27: 1000 mg via ORAL
  Filled 2016-01-27: qty 2

## 2016-01-27 MED ORDER — CARBAMAZEPINE 200 MG PO TABS
200.0000 mg | ORAL_TABLET | Freq: Once | ORAL | Status: AC
  Administered 2016-01-27: 200 mg via ORAL
  Filled 2016-01-27: qty 1

## 2016-01-27 NOTE — Discharge Instructions (Signed)
It is most likely that you had a seizure because you were not able to take your medications. We have given you a dose of your medication tonight and have sent in a prescription for your medication which you should resume taking 2 times every day. It will be important for you to follow up with your doctor in order to have your medications refilled.

## 2016-01-27 NOTE — ED Triage Notes (Signed)
Pt had an unwitnessed seizure. Spouse found him seizing on the floor. EMS stated  Post tictal. PT A+O in triage

## 2016-01-27 NOTE — ED Provider Notes (Signed)
[facility]-EMERGENCY DEPT Provider Note   CSN: [id number] Arrival date & time: 01/27/16  2018     History   Chief Complaint Chief Complaint  Patient presents with  . Seizures    HPI Jason Flowers is a 45 y.o. male.  Patient presented with reported seizures witnessed by his girlfriend. She notes that he was sitting on a couch in the residence and when she entered the room he had slid on the floor and had some vocalization which sounded like snoring. As she approached she noted that the patient was jerking with repetitive movements of his arms and legs. Patient attempted to stand and then fell and hit his bridge of his nose on a piece of furniture. At this point she called EMS and by the time of arrival at the scene patient's seizure had resolved. Patient was initially confused and disoriented. He slowly cleared and by the time of presentation at the emergency department he was oriented 3 but still reported some mild confusion. He also complains of significant frontal headache. Denies any neck pain, fevers, chills, chest pain, shortness of breath, recent sick contacts. Patient notes that prior to this episode she was in his normal state of health. Patient does complain of lower extremity myalgias. He denies any tongue biting, but did have some urinary incontinence.  Patient has a long history of seizures since childhood and has been treated for the last 20 years on carbamazepine. Reports that he typically gets one seizure per year. Most recent seizure was last December. He notes that he ran out of his medication several weeks ago, but his PCP was unable to call him in a new prescription until his appointment scheduled for October.     Past Medical History:  Diagnosis Date  . Seizure disorder (HCC)   . Seizures (HCC)    since child     Patient Active Problem List   Diagnosis Date Noted  . Seizure disorder (HCC) 08/05/2014    History reviewed. No pertinent surgical history.     Home  Medications    Prior to Admission medications   Medication Sig Start Date End Date Taking? Authorizing Provider  carbamazepine (TEGRETOL) 200 MG tablet Take 1 tablet (200 mg total) by mouth 2 (two) times daily. 01/27/16 03/27/16  Carolynn Comment, MD    Family History Family History  Problem Relation Age of Onset  . Coronary artery disease Mother   . Cancer Father     pancreatic  . Cancer Sister   . Seizures Sister   . Seizures Son     Social History Social History  Substance Use Topics  . Smoking status: Never Smoker  . Smokeless tobacco: Not on file  . Alcohol use No     Allergies   Review of patient's allergies indicates no known allergies.   Review of Systems Review of Systems  Constitutional: Negative for chills and fever.  HENT: Negative for ear pain and sore throat.   Eyes: Negative for pain and visual disturbance.  Respiratory: Negative for cough and shortness of breath.   Cardiovascular: Negative for chest pain and palpitations.  Gastrointestinal: Negative for abdominal pain and vomiting.  Genitourinary: Negative for dysuria and hematuria.  Musculoskeletal: Positive for myalgias (LE). Negative for arthralgias and back pain.  Skin: Negative for color change and rash.  Neurological: Positive for seizures and headaches (frontal). Negative for syncope.  All other systems reviewed and are negative.    Physical Exam Updated Vital Signs BP 110/67   Pulse 84  Temp 97.6 F (36.4 C)   Resp (!) 0   SpO2 98%   Physical Exam  Constitutional: He is oriented to person, place, and time. He appears well-developed and well-nourished. He appears lethargic.  HENT:  Head: Normocephalic.  Small laceration of the nasal bridge  Eyes: Conjunctivae are normal.  Neck: Neck supple.  Cardiovascular: Normal rate and regular rhythm.   No murmur heard. Pulmonary/Chest: Effort normal and breath sounds normal. No respiratory distress.  Abdominal: Soft. There is no tenderness.    Musculoskeletal: He exhibits no edema or tenderness.  Neurological: He is oriented to person, place, and time. He has normal strength. He appears lethargic. No cranial nerve deficit or sensory deficit.  Skin: Skin is warm and dry. He is not diaphoretic.  Psychiatric: He has a normal mood and affect.  Nursing note and vitals reviewed.   ED Treatments / Results  Labs (all labs ordered are listed, but only abnormal results are displayed) Labs Reviewed  CARBAMAZEPINE LEVEL, TOTAL  BASIC METABOLIC PANEL  CBG MONITORING, ED    EKG  EKG Interpretation None       Radiology No results found.  Procedures Procedures (including critical care time)  Medications Ordered in ED Medications  acetaminophen (TYLENOL) tablet 1,000 mg (1,000 mg Oral Given 01/27/16 2225)  sodium chloride 0.9 % bolus 1,000 mL (not administered)  carbamazepine (TEGRETOL) tablet 200 mg (200 mg Oral Given 01/27/16 2225)     Initial Impression / Assessment and Plan / ED Course  I have reviewed the triage vital signs and the nursing notes.  Pertinent labs & imaging results that were available during my care of the patient were reviewed by me and considered in my medical decision making (see chart for details).  Clinical Course   Patient with known seizure disorder who was noncompliant with medications times several weeks several witnessed seizure. He was still mildly postictal on presentation to the ED. Tegretol level was checked, basic chemistries were checked. Patient was monitored in the ED and given 1L NS bolus. His post-ictal confusion cleared. Patient was given a dose of 200 mg carbamazepine and will be discharged to home with a refill of his medications will return to his primary provider appointment.  Final Clinical Impressions(s) / ED Diagnoses   Final diagnoses:  Seizure [facility](HCC)    New Prescriptions Current Discharge Medication List       Carolynn CommentBryan Ahan Eisenberger, MD 01/27/16 [id number]    Blane OharaJoshua Zavitz,  MD 01/27/16 2325

## 2016-01-28 LAB — BASIC METABOLIC PANEL
ANION GAP: 8 (ref 5–15)
BUN: 15 mg/dL (ref 6–20)
CALCIUM: 9.6 mg/dL (ref 8.9–10.3)
CO2: 29 mmol/L (ref 22–32)
Chloride: 103 mmol/L (ref 101–111)
Creatinine, Ser: 1.68 mg/dL — ABNORMAL HIGH (ref 0.61–1.24)
GFR, EST AFRICAN AMERICAN: 55 mL/min — AB (ref >60)
GFR, EST NON AFRICAN AMERICAN: 47 mL/min — AB (ref >60)
Glucose, Bld: 88 mg/dL (ref 65–99)
POTASSIUM: 4.1 mmol/L (ref 3.5–5.1)
Sodium: 140 mmol/L (ref 135–145)

## 2016-01-28 LAB — CARBAMAZEPINE LEVEL, TOTAL

## 2016-01-28 NOTE — ED Provider Notes (Addendum)
Results for orders placed or performed during the hospital encounter of 01/27/16  Carbamazepine level, total  Result Value Ref Range   Carbamazepine Lvl <2.0 (L) 4.0 - 12.0 ug/mL  Basic metabolic panel  Result Value Ref Range   Sodium 140 135 - 145 mmol/L   Potassium 4.1 3.5 - 5.1 mmol/L   Chloride 103 101 - 111 mmol/L   CO2 29 22 - 32 mmol/L   Glucose, Bld 88 65 - 99 mg/dL   BUN 15 6 - 20 mg/dL   Creatinine, Ser 0.981.68 (H) 0.61 - 1.24 mg/dL   Calcium 9.6 8.9 - 11.910.3 mg/dL   GFR calc non Af Amer 47 (L) >60 mL/min   GFR calc Af Amer 55 (L) >60 mL/min   Anion gap 8 5 - 15  CBG monitoring, ED  Result Value Ref Range   Glucose-Capillary 72 65 - 99 mg/dL   No results found. Pt with no further sz activity.  Ambulates without problem.  Discharged.  Given rx and discharge instructions per Dr. Marinda ElkStrelow.  Will f/u with PCP.  Return precautions given.  Creatinine is similar to prior values.   Rolan BuccoMelanie Siren Porrata, MD 01/28/16 [id number]    Rolan BuccoMelanie Theadora Noyes, MD 01/28/16 [id number]

## 2016-01-28 NOTE — ED Notes (Signed)
Pt understood dc material. NAD noted. Scripts and paperwork given at dc 

## 2016-01-28 NOTE — ED Notes (Signed)
Pt ambulated in hall. Steady gait. No Complaints of dizziness or nausea. No assistance needed.

## 2016-01-31 MED FILL — ?CARBAMAZEPINE 200 MG TABLE: 200 | 30 days supply | Qty: 60 | Fill #0

## 2016-02-02 ENCOUNTER — Ambulatory Visit: Payer: Self-pay | Attending: Internal Medicine | Admitting: Physician Assistant

## 2016-02-02 DIAGNOSIS — G40909 Epilepsy, unspecified, not intractable, without status epilepticus: Secondary | ICD-10-CM

## 2016-02-02 MED ORDER — CARBAMAZEPINE 200 MG PO TABS: 200.0000 mg | ORAL_TABLET | Freq: Two times a day (BID) | ORAL | 0 refills | Status: DC

## 2016-02-02 NOTE — Progress Notes (Signed)
Patient ID: Jason Flowers, male   DOB: 08/01/1970, 45 y.o.   MRN: [id number]   Jason Flowers, is a 45 y.o. male  WUJ:[id number]  [id number]  DOB - 1971-02-23  Subjective:  Chief Complaint and HPI: Jason Flowers is a 45 y.o. male here today to establish care and for a follow up visit after being seen in the ED for a seizure on 01/27/2016.  He has had seizures for about 20 years that have been controlled on Tegretol 200mg  bid.  He usu only has a seizure about once a year and usu only if he has been without his medication.  He had been out of medication for several weeks when he had the seizure this time.  He has been feeling fine since leaving the hospital.  He is an avid weight lifter and does not drink a lot of water.  He denies any supplements or excess protein intake.  He has had elevated serum creatnine in the past.    ED/Hospital notes/labs reviewed.     ROS:   Constitutional:  No f/c, No night sweats, No unexplained weight loss. EENT:  No vision changes, No blurry vision, No hearing changes. No mouth, throat, or ear problems.  Respiratory: No cough, No SOB Cardiac: No CP, no palpitations GI:  No abd pain, No N/V/D. GU: No Urinary s/sx Musculoskeletal: No joint pain Neuro: No headache, no dizziness, no motor weakness.  Skin: No rash Endocrine:  No polydipsia. No polyuria.  Psych: Denies SI/HI  No problems updated.  ALLERGIES: No Known Allergies  PAST MEDICAL HISTORY: Past Medical History:  Diagnosis Date  . Seizure disorder (HCC)   . Seizures (HCC)    since child     MEDICATIONS AT HOME: Prior to Admission medications   Medication Sig Start Date End Date Taking? Authorizing Provider  carbamazepine (TEGRETOL) 200 MG tablet Take 1 tablet (200 mg total) by mouth 2 (two) times daily. 02/02/16 04/02/16  Anders Simmonds, PA-C     Objective:  EXAM:   Vitals:   02/02/16 1449  BP: 96/61  Pulse: 80  Resp: 16  Temp: 98.5 F (36.9 C)  TempSrc: Oral  SpO2: 98%    Weight: 207 lb (93.9 kg)    General appearance : A&OX3. NAD. Non-toxic-appearing HEENT: Atraumatic and Normocephalic.  PERRLA. EOM intact.  TM clear B. Mouth-MMM Neck: supple, no JVD. No cervical lymphadenopathy. No thyromegaly Chest/Lungs:  Breathing-non-labored, Good air entry bilaterally, breath sounds normal without rales, rhonchi, or wheezing  CVS: S1 S2 regular, no murmurs, gallops, rubs  Extremities: Bilateral Lower Ext shows no edema, both legs are warm to touch with = pulse throughout Neurology:  CN II-XII grossly intact, Non focal.   Psych:  TP linear. J/I WNL. Normal speech. Appropriate eye contact and affect.  Skin:  No Rash  Data Review No results found for: HGBA1C   Assessment & Plan   1. Seizure disorder (HCC) Too early to check level bc only back on meds X 6 days - carbamazepine (TEGRETOL) 200 MG tablet; Take 1 tablet (200 mg total) by mouth 2 (two) times daily.  Dispense: 60 tablet; Refill: 0 Increase water intake to see if kidney function will improve.   Patient have been counseled extensively about nutrition and exercise  Return in about 3 weeks (around 02/23/2016) for establish with PCP; check tegretol level/bmp.  The patient was given clear instructions to go to ER or return to medical center if symptoms don't improve, worsen or new problems develop. The patient  verbalized understanding. The patient was told to call to get lab results if they haven't heard anything in the next week.     Georgian CoAngela McClung, PA-C [facility] [place], [place] [phone]   02/02/2016, 4:50 PM

## 2016-02-02 NOTE — Patient Instructions (Signed)
Increase water intake to 80-100 ounces daily

## 2016-02-02 NOTE — Progress Notes (Signed)
F/u seizure, History of epilepsy. Patient concerned of blurred vision.

## 2016-03-12 ENCOUNTER — Ambulatory Visit: Payer: Self-pay | Attending: Family Medicine | Admitting: Family Medicine

## 2016-03-12 ENCOUNTER — Encounter: Payer: Self-pay | Admitting: Family Medicine

## 2016-03-12 DIAGNOSIS — N189 Chronic kidney disease, unspecified: Secondary | ICD-10-CM | POA: Insufficient documentation

## 2016-03-12 DIAGNOSIS — Z23 Encounter for immunization: Secondary | ICD-10-CM | POA: Insufficient documentation

## 2016-03-12 DIAGNOSIS — N179 Acute kidney failure, unspecified: Secondary | ICD-10-CM | POA: Insufficient documentation

## 2016-03-12 DIAGNOSIS — N183 Chronic kidney disease, stage 3 unspecified: Secondary | ICD-10-CM

## 2016-03-12 DIAGNOSIS — G40909 Epilepsy, unspecified, not intractable, without status epilepticus: Secondary | ICD-10-CM | POA: Insufficient documentation

## 2016-03-12 MED ORDER — CARBAMAZEPINE 200 MG PO TABS: 200.0000 mg | ORAL_TABLET | Freq: Two times a day (BID) | ORAL | 2 refills | Status: DC

## 2016-03-12 MED FILL — ?CARBAMAZEPINE 200 MG TABLE: 200 | 30 days supply | Qty: 60 | Fill #0

## 2016-03-13 LAB — COMPLETE METABOLIC PANEL WITH GFR
ALBUMIN: 4.2 g/dL (ref 3.6–5.1)
ALK PHOS: 90 U/L (ref 40–115)
ALT: 19 U/L (ref 9–46)
AST: 25 U/L (ref 10–40)
BUN: 16 mg/dL (ref 7–25)
CALCIUM: 9.6 mg/dL (ref 8.6–10.3)
CHLORIDE: 105 mmol/L (ref 98–110)
CO2: 23 mmol/L (ref 20–31)
Creat: 1.43 mg/dL — ABNORMAL HIGH (ref 0.60–1.35)
GFR, EST NON AFRICAN AMERICAN: 58 mL/min — AB (ref >=60)
GFR, Est African American: 67 mL/min (ref >=60)
GLUCOSE: 75 mg/dL (ref 65–99)
POTASSIUM: 4.2 mmol/L (ref 3.5–5.3)
SODIUM: 141 mmol/L (ref 135–146)
Total Bilirubin: 0.2 mg/dL (ref 0.2–1.2)
Total Protein: 7.5 g/dL (ref 6.1–8.1)

## 2016-03-13 NOTE — Progress Notes (Signed)
Subjective:  Patient ID: Jason Flowers, male    DOB: 1970/10/17  Age: 45 y.o. MRN: [id number]  CC: Seizures (patient feels they are out of control); Weight Loss; and Eye Problem (not seeing well)   HPI Jason Flowers is a 45 year old male with a history of Seizures Who comes into the clinic for an ED follow-up after being seen on 01/27/16 for a seizure. The patient endorses running out of his carbamazepine which was refilled prior to his discharge.  He has been compliant with his medication and has not had a seizure since the last episode 6 weeks ago. He has had a history of seizures since childhood which have been controlled on carbamazepine until he ran out prior to presentation. His labs reveal chronic kidney injury with a creatinine of 1.68; he denies any history of chronic NSAID use.  Past Medical History:  Diagnosis Date  . Seizure disorder (HCC)   . Seizures (HCC)    since child     History reviewed. No pertinent surgical history.  No Known Allergies   Outpatient Medications Prior to Visit  Medication Sig Dispense Refill  . carbamazepine (TEGRETOL) 200 MG tablet Take 1 tablet (200 mg total) by mouth 2 (two) times daily. 60 tablet 0   No facility-administered medications prior to visit.     ROS Review of Systems  Constitutional: Negative for activity change and appetite change.  HENT: Negative for sinus pressure and sore throat.   Eyes: Negative for visual disturbance.  Respiratory: Negative for cough, chest tightness and shortness of breath.   Cardiovascular: Negative for chest pain and leg swelling.  Gastrointestinal: Negative for abdominal distention, abdominal pain, constipation and diarrhea.  Endocrine: Negative.   Genitourinary: Negative for dysuria.  Musculoskeletal: Negative for joint swelling and myalgias.  Skin: Negative for rash.  Allergic/Immunologic: Negative.   Neurological: Negative for weakness, light-headedness and numbness.    Psychiatric/Behavioral: Negative for dysphoric mood and suicidal ideas.    Objective:  BP 104/68 (BP Location: Right Arm, Patient Position: Sitting, Cuff Size: Large)   Pulse 79   Temp 98 F (36.7 C) (Oral)   Ht 5' 5.5" (1.664 m)   Wt 206 lb 12.8 oz (93.8 kg)   SpO2 97%   BMI 33.89 kg/m   BP/Weight 03/12/2016 02/02/2016 01/28/2016  Systolic BP 104 96 100  Diastolic BP 68 61 54  Wt. (Lbs) 206.8 207 -  BMI 33.89 34.45 -      Physical Exam  Constitutional: He is oriented to person, place, and time. He appears well-developed and well-nourished.  Cardiovascular: Normal rate, normal heart sounds and intact distal pulses.   No murmur heard. Pulmonary/Chest: Effort normal and breath sounds normal. He has no wheezes. He has no rales. He exhibits no tenderness.  Abdominal: Soft. Bowel sounds are normal. He exhibits no distension and no mass. There is no tenderness.  Musculoskeletal: Normal range of motion.  Neurological: He is alert and oriented to person, place, and time.     Assessment & Plan:   1. Seizure disorder (HCC) Advised against driving - carbamazepine (TEGRETOL) 200 MG tablet; Take 1 tablet (200 mg total) by mouth 2 (two) times daily.  Dispense: 60 tablet; Refill: 2  2. chronic kidney injury (HCC) Avoid nephrotoxins - COMPLETE METABOLIC PANEL WITH GFR  3. Encounter for immunization - carbamazepine (TEGRETOL) 200 MG tablet; Take 1 tablet (200 mg total) by mouth 2 (two) times daily.  Dispense: 60 tablet; Refill: 2 - COMPLETE METABOLIC PANEL WITH GFR -  Flu Vaccine QUAD 36+ mos IM   Meds ordered this encounter  Medications  . carbamazepine (TEGRETOL) 200 MG tablet    Sig: Take 1 tablet (200 mg total) by mouth 2 (two) times daily.    Dispense:  60 tablet    Refill:  2    Follow-up: Return in about 2 months (around 05/12/2016) for follow up of seizures.   Jaclyn ShaggyEnobong Amao MD

## 2016-03-20 ENCOUNTER — Telehealth: Payer: Self-pay

## 2016-03-20 NOTE — Telephone Encounter (Signed)
Writer called patient Jason Flowers to discuss his lab results.  Patient stated understanding.

## 2016-03-20 NOTE — Telephone Encounter (Signed)
-----   Message from Jaclyn ShaggyEnobong Amao, MD sent at 03/13/2016  2:23 PM EDT ----- Kidney function has improved slightly compared to last set of labs.

## 2016-04-23 MED FILL — ?CARBAMAZEPINE 200 MG TABLE: 200 | 30 days supply | Qty: 60 | Fill #0

## 2016-05-07 ENCOUNTER — Ambulatory Visit: Payer: Self-pay | Attending: Family Medicine | Admitting: Family Medicine

## 2016-05-07 ENCOUNTER — Encounter: Payer: Self-pay | Admitting: Family Medicine

## 2016-05-07 VITALS — BP 109/67 | HR 81 | Temp 98.0°F | Ht 65.0 in | Wt 211.2 lb

## 2016-05-07 DIAGNOSIS — G40909 Epilepsy, unspecified, not intractable, without status epilepticus: Secondary | ICD-10-CM | POA: Insufficient documentation

## 2016-05-07 DIAGNOSIS — Z13228 Encounter for screening for other metabolic disorders: Secondary | ICD-10-CM | POA: Insufficient documentation

## 2016-05-07 DIAGNOSIS — N183 Chronic kidney disease, stage 3 unspecified: Secondary | ICD-10-CM

## 2016-05-07 DIAGNOSIS — Z79899 Other long term (current) drug therapy: Secondary | ICD-10-CM | POA: Insufficient documentation

## 2016-05-07 MED ORDER — CARBAMAZEPINE 200 MG PO TABS: 200.0000 mg | ORAL_TABLET | Freq: Two times a day (BID) | ORAL | 5 refills | Status: DC
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
## 2016-05-07 NOTE — Patient Instructions (Signed)
Epilepsy °Epilepsy is a condition in which a person has repeated seizures over time. A seizure is a sudden burst of abnormal electrical and chemical activity in the brain. Seizures can cause a change in attention, behavior, or the ability to remain awake and alert (altered mental status). °Epilepsy increases a person's risk of falls, accidents, and injury. It can also lead to complications, including: °· Depression. °· Poor memory. °· Sudden unexplained death in epilepsy (SUDEP). This complication is rare, and its cause is not known. °Most people with epilepsy lead normal lives. °What are the causes? °This condition may be caused by: °· A head injury. °· An injury that happens at birth. °· A high fever during childhood. °· A stroke. °· Bleeding that goes into or around the brain. °· Certain medicines and drugs. °· Having too little oxygen for a long period of time. °· Abnormal brain development. °· Certain infections, such as meningitis and encephalitis. °· Brain tumors. °· Conditions that are passed along from parent to child (are hereditary). °What are the signs or symptoms? °Symptoms of a seizure vary greatly from person to person. They include: °· Convulsions. °· Stiffening of the body. °· Involuntary movements of the arms or legs. °· Loss of consciousness. °· Breathing problems. °· Falling suddenly. °· Confusion. °· Head nodding. °· Eye blinking or fluttering. °· Lip smacking. °· Drooling. °· Rapid eye movements. °· Grunting. °· Loss of bladder control and bowel control. °· Staring. °· Unresponsiveness. °Some people have symptoms right before a seizure happens (aura) and right after a seizure happens. Symptoms of an aura include: °· Fear or anxiety. °· Nausea. °· Feeling like the room is spinning (vertigo). °· A feeling of having seen or heard something before (deja vu). °· Odd tastes or smells. °· Changes in vision, such as seeing flashing lights or spots. °Symptoms that follow a seizure  include: °· Confusion. °· Sleepiness. °· Headache. °How is this diagnosed? °This condition is diagnosed based on: °· Your symptoms. °· Your medical history. °· A physical exam. °· A neurological exam. A neurological exam is similar to a physical exam. It involves checking your strength, reflexes, coordination, and sensations. °· Tests, such as: °¨ An electroencephalogram (EEG). This is a painless test that creates a diagram of your brain waves. °¨ An MRI of the brain. °¨ A CT scan of the brain. °¨ A lumbar puncture, also called a spinal tap. °¨ Blood tests to check for signs of infection or abnormal blood chemistry. °How is this treated? °There is no cure for this condition, but treatment can help control seizures. Treatment may involve: °· Taking medicines to control seizures. These include medicines to prevent seizures and medicines to stop seizures as they occur. °· Having a device called a vagus nerve stimulator implanted in the chest. The device sends electrical impulses to the vagus nerve and to the brain to prevent seizures. This treatment may be recommended if medicines do not help. °· Brain surgery. There are several kinds of surgeries that may be done to stop seizures from happening or to reduce how often seizures happen. °· Having regular blood tests. You may need to have blood tests regularly to check that you are getting the right amount of medicine. °Once this condition has been diagnosed, it is important to begin treatment as soon as possible. For some people, epilepsy eventually goes away. °Follow these instructions at home: °Medicines  ° °· Take over-the-counter and prescription medicines only as told by your health care provider. °·   Avoid any substances that may prevent your medicine from working properly, such as alcohol. °Activity  °· Get enough rest. Lack of sleep can make seizures more likely to occur. °· Follow instructions from your health care provider about driving, swimming, and doing any  other activities that would be dangerous if you had a seizure. °Educating others  °Teach friends and family what to do if you have a seizure. They should: °· Lay you on the ground to prevent a fall. °· Cushion your head and body. °· Loosen any tight clothing around your neck. °· Turn you on your side. If vomiting occurs, this helps keep your airway clear. °· Stay with you until you recover. °· Not hold you down. Holding you down will not stop the seizure. °· Not put anything in your mouth. °· Know whether or not you need emergency care. °General instructions  °· Avoid anything that has ever triggered a seizure for you. °· Keep a seizure diary. Record what you remember about each seizure, especially anything that might have triggered the seizure. °· Keep all follow-up visits as told by your health care provider. This is important. °Contact a health care provider if: °· Your seizure pattern changes. °· You have symptoms of infection or another illness. This might increase your risk of having a seizure. °Get help right away if: °· You have a seizure that does not stop after 5 minutes. °· You have several seizures in a row without a complete recovery in between seizures. °· You have a seizure that makes it harder to breathe. °· You have a seizure that is different from previous seizures. °· You have a seizure that leaves you unable to speak or use a part of your body. °· You did not wake up immediately after a seizure. °This information is not intended to replace advice given to you by your health care provider. Make sure you discuss any questions you have with your health care provider. °Document Released: 05/14/2005 Document Revised: 12/10/2015 Document Reviewed: 11/22/2015 °Elsevier Interactive Patient Education © 2017 [employer] ° °

## 2016-05-10 ENCOUNTER — Ambulatory Visit: Payer: Self-pay | Attending: Family Medicine

## 2016-05-10 DIAGNOSIS — N183 Chronic kidney disease, stage 3 unspecified: Secondary | ICD-10-CM

## 2016-05-10 DIAGNOSIS — Z13228 Encounter for screening for other metabolic disorders: Secondary | ICD-10-CM | POA: Insufficient documentation

## 2016-05-10 LAB — COMPLETE METABOLIC PANEL WITH GFR
ALT: 20 U/L (ref 9–46)
AST: 28 U/L (ref 10–40)
Albumin: 4 g/dL (ref 3.6–5.1)
Alkaline Phosphatase: 68 U/L (ref 40–115)
BUN: 16 mg/dL (ref 7–25)
CHLORIDE: 105 mmol/L (ref 98–110)
CO2: 28 mmol/L (ref 20–31)
Calcium: 9.1 mg/dL (ref 8.6–10.3)
Creat: 1.63 mg/dL — ABNORMAL HIGH (ref 0.60–1.35)
GFR, EST AFRICAN AMERICAN: 58 mL/min — AB (ref >=60)
GFR, EST NON AFRICAN AMERICAN: 50 mL/min — AB (ref >=60)
Glucose, Bld: 88 mg/dL (ref 65–99)
POTASSIUM: 4.3 mmol/L (ref 3.5–5.3)
Sodium: 139 mmol/L (ref 135–146)
Total Bilirubin: 0.4 mg/dL (ref 0.2–1.2)
Total Protein: 7.1 g/dL (ref 6.1–8.1)

## 2016-05-10 LAB — LIPID PANEL
CHOLESTEROL: 150 mg/dL (ref <200)
HDL: 57 mg/dL (ref >40)
LDL Cholesterol: 83 mg/dL (ref <100)
TRIGLYCERIDES: 51 mg/dL (ref <150)
Total CHOL/HDL Ratio: 2.6 Ratio (ref <5.0)
VLDL: 10 mg/dL (ref <30)

## 2016-05-10 NOTE — Progress Notes (Signed)
Patient here for lab visit only 

## 2016-05-15 ENCOUNTER — Telehealth: Payer: Self-pay

## 2016-05-15 NOTE — Telephone Encounter (Signed)
-----   Message from Jaclyn ShaggyEnobong Amao, MD sent at 05/11/2016  4:53 PM EST ----- Renal function has worsened slightly. Advise to avoid NSAIDS

## 2016-05-15 NOTE — Telephone Encounter (Signed)
Writer called patient per Dr. Amao and discussed lab results.  Patient stated understanding. 

## 2016-06-18 MED FILL — ?CARBAMAZEPINE 200 MG TABLE: 200 | 30 days supply | Qty: 60 | Fill #1

## 2016-07-29 IMAGING — CR DG CHEST 2V
2 series · 2 of 2 positions shown · non-contrast
Comparison: July 19, 2006

CLINICAL DATA: Left upper chest pain today.

EXAM:
CHEST  2 VIEW

[w chest pa]
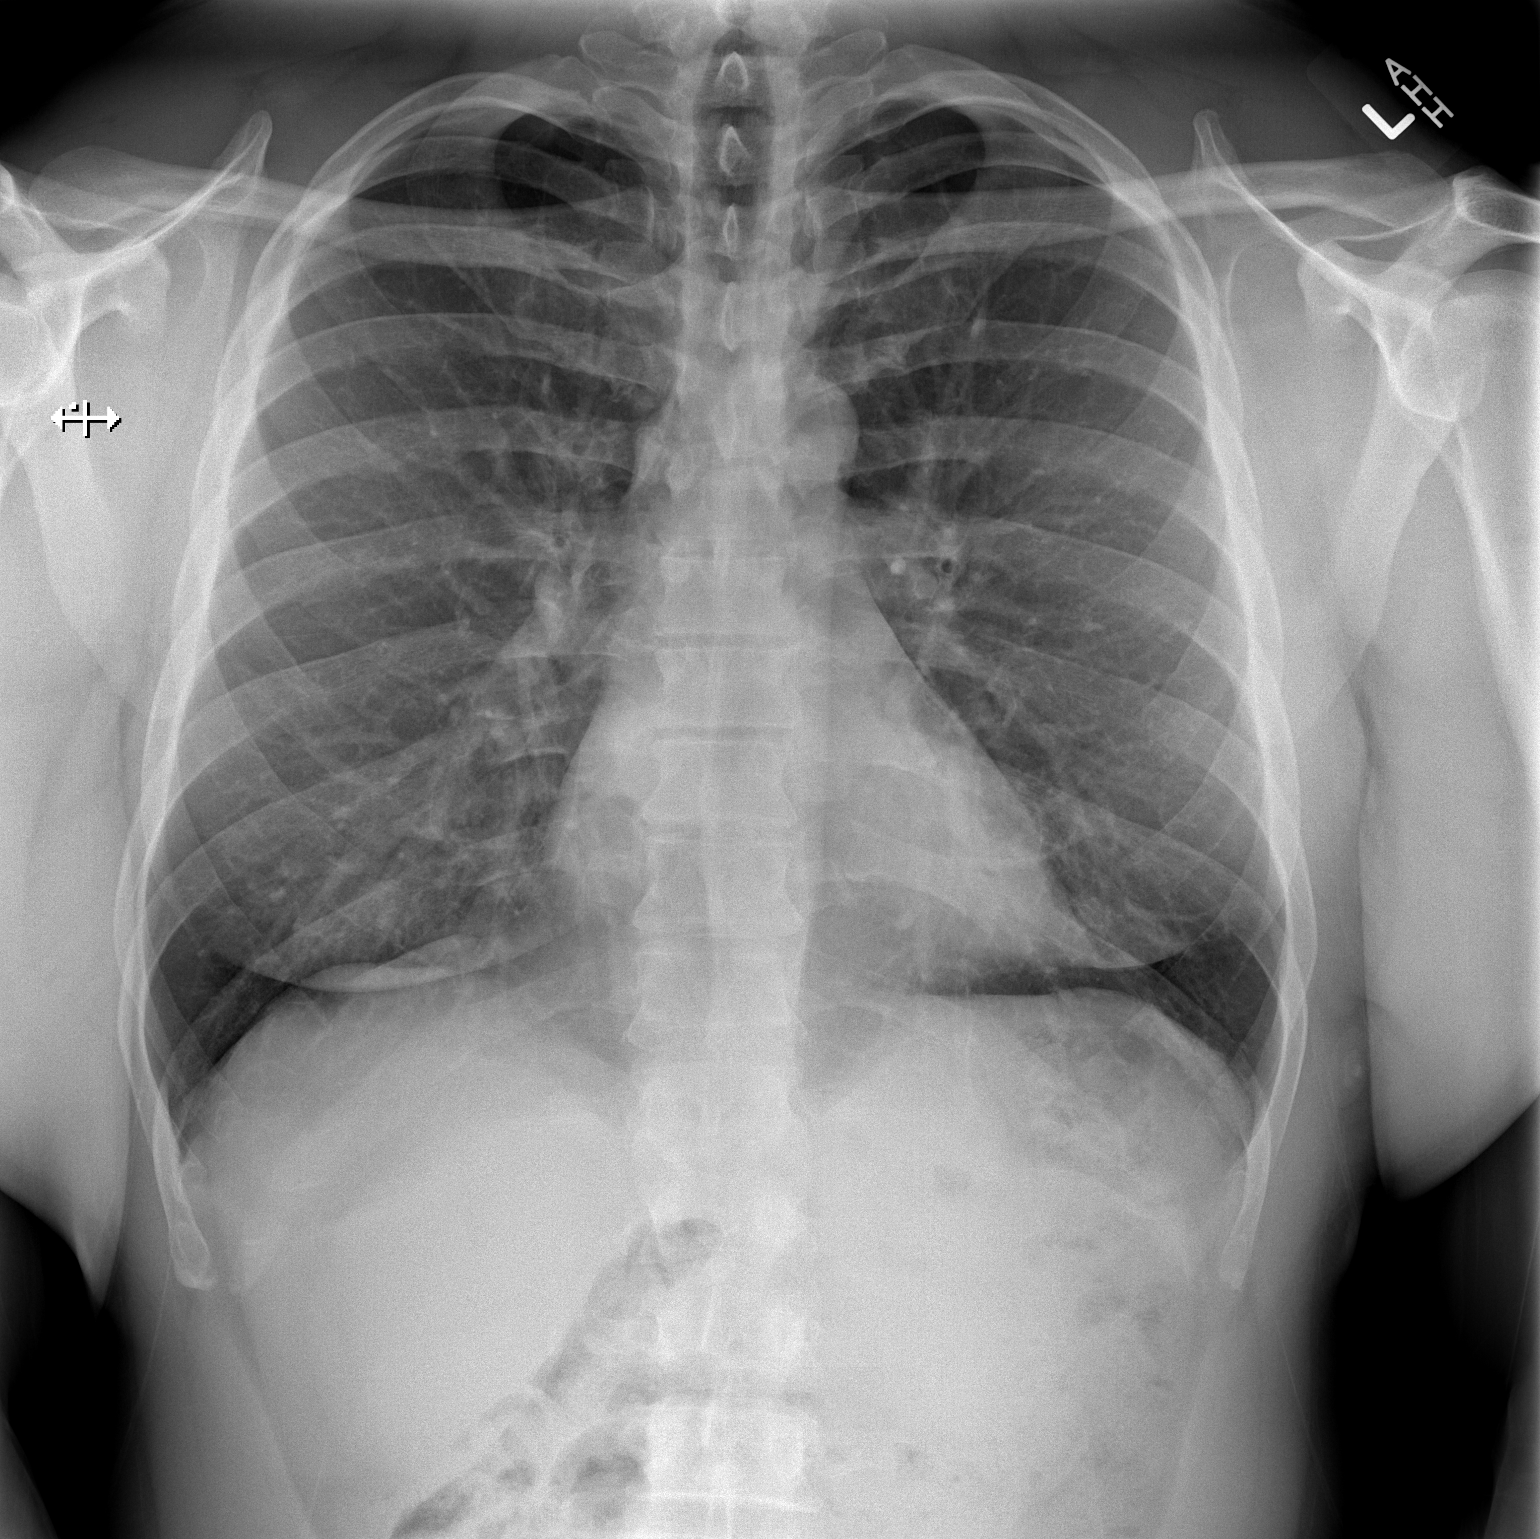

[w chest lat]
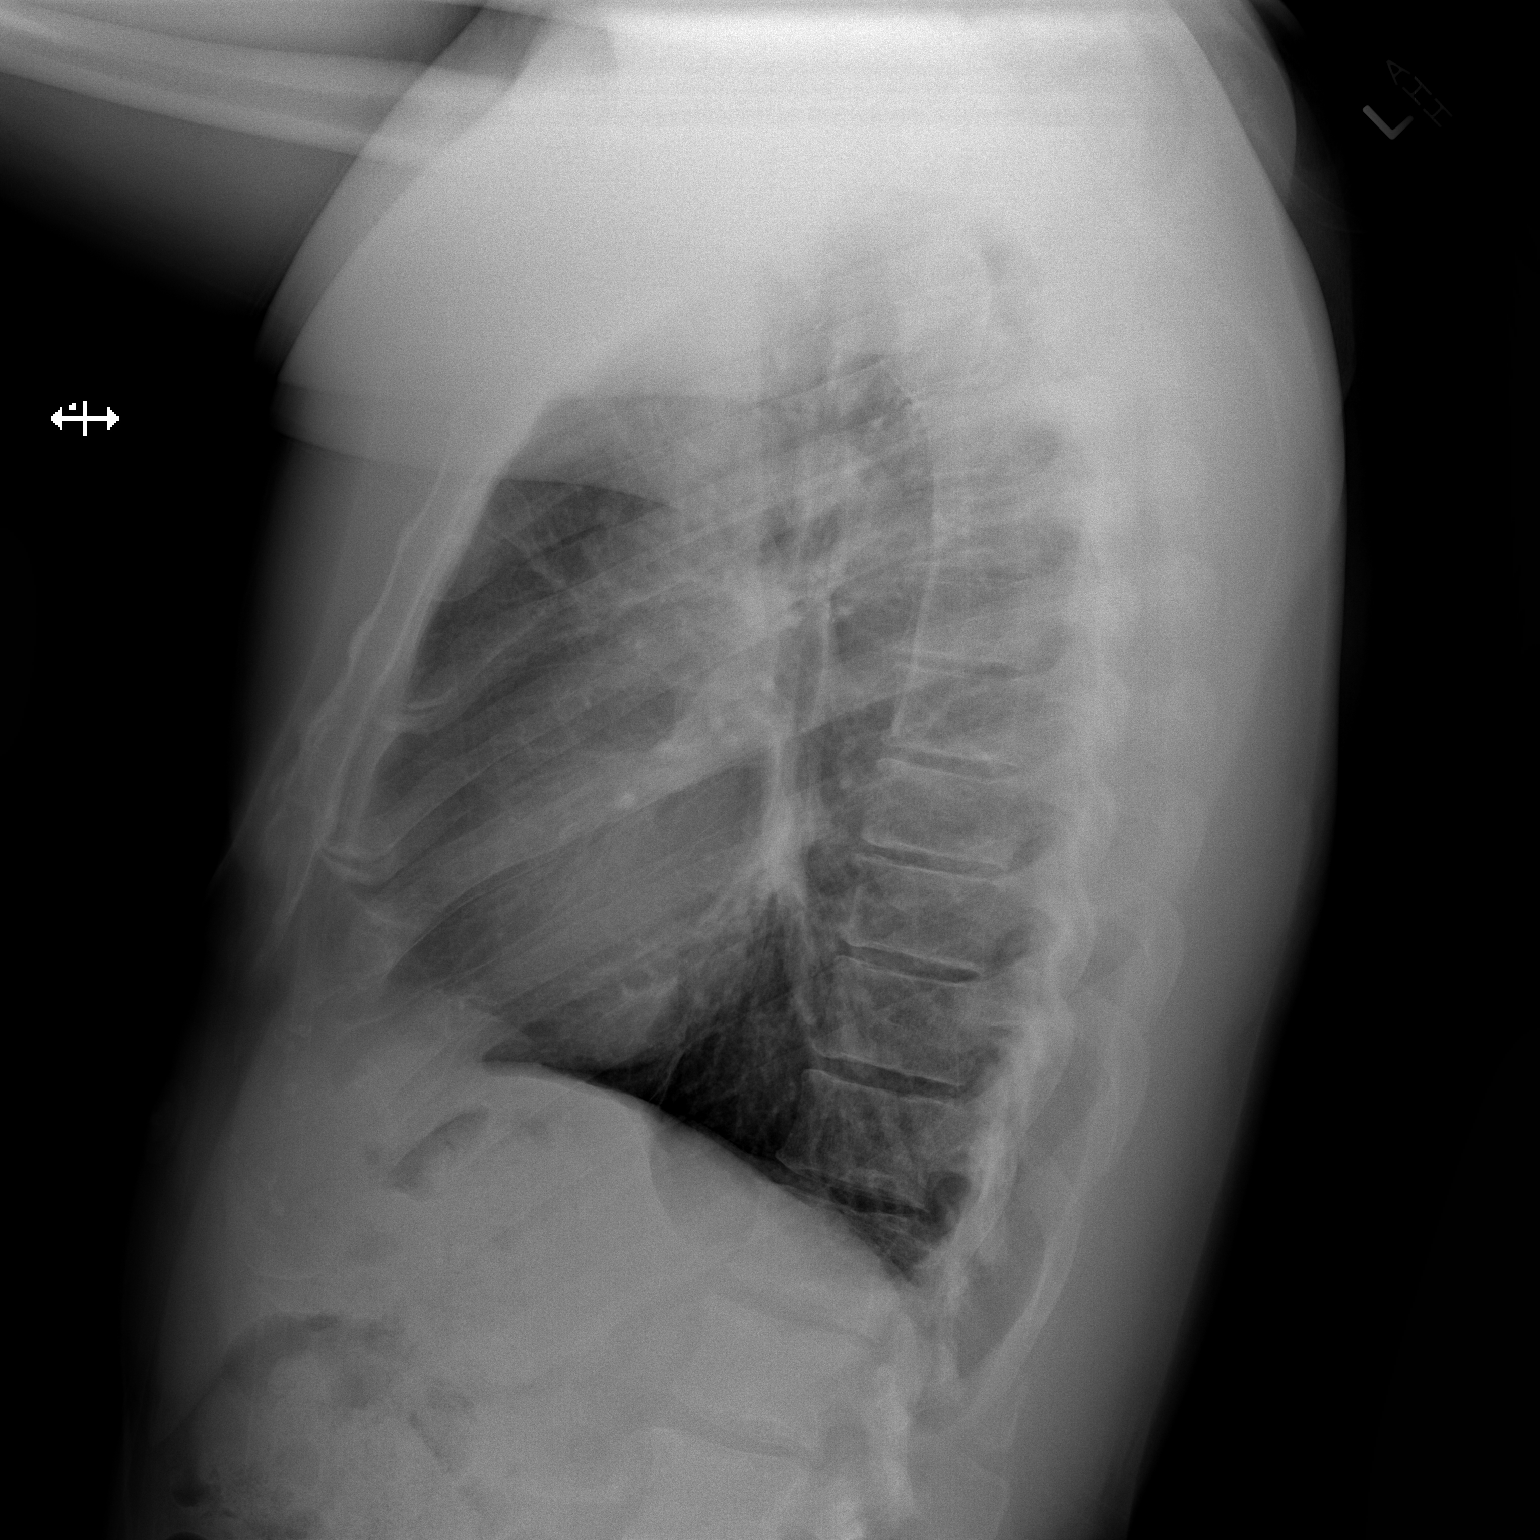

[2 of 2 positions shown; findings below may reference images not displayed]

FINDINGS: The heart size and mediastinal contours are within normal limits.
There is no focal infiltrate, pulmonary edema, or pleural effusion.
The visualized skeletal structures are unremarkable.
IMPRESSION: No active cardiopulmonary disease.

## 2016-07-29 IMAGING — CT CT ANGIO CHEST
3 of 7 series · 19 of 46 positions shown · IV contrast (omnipaque)
Comparison: Chest x-ray dated 10/10/2014

CLINICAL DATA: Chest pain.

EXAM:
CT ANGIOGRAPHY CHEST WITH CONTRAST
TECHNIQUE: Multidetector CT imaging of the chest was performed using the
standard protocol during bolus administration of intravenous
contrast. Multiplanar CT image reconstructions and MIPs were
obtained to evaluate the vascular anatomy.
CONTRAST:  100mL OMNIPAQUE IOHEXOL 350 MG/ML SOLN

[Series 5: arterial 3.0 b30f · axial · arterial · 0.68mm/px · z∈[+1941,+2202]mm · 14 of 101 slices shown]
[im 7/101  lung]
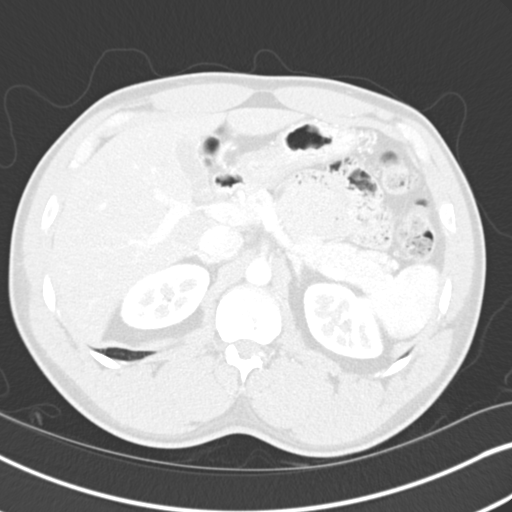
[im 14/101  soft-tissue]
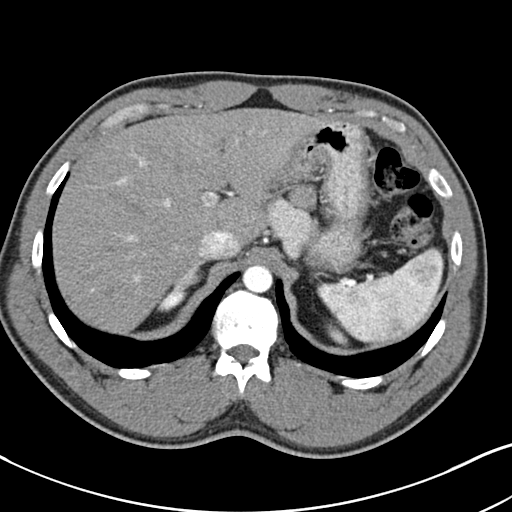
[im 21/101  lung]
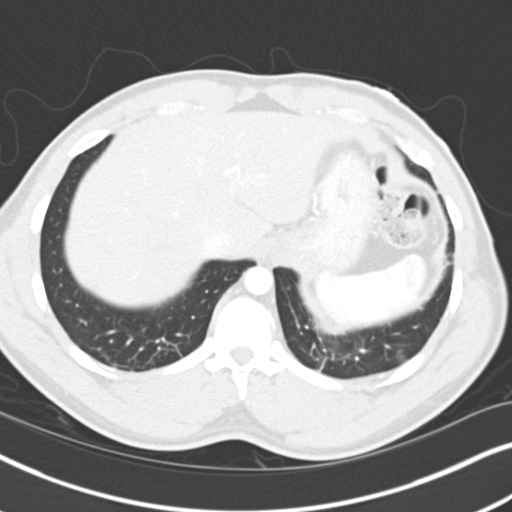
[im 27/101  soft-tissue]
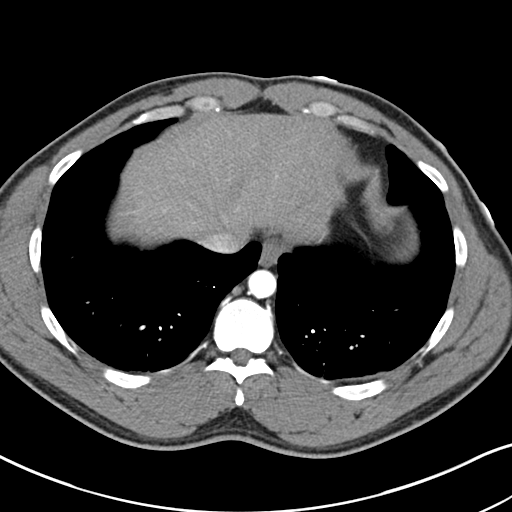
[im 34/101  lung]
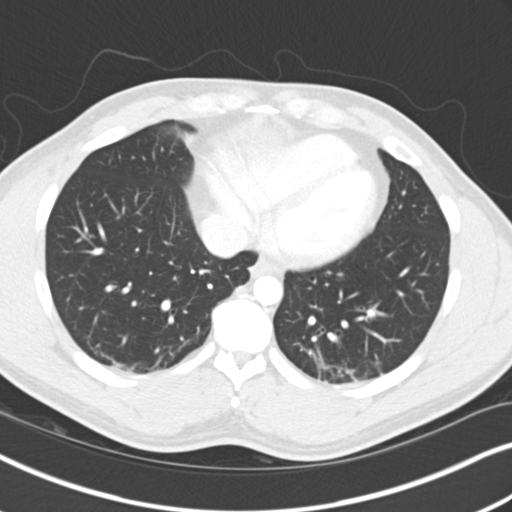
[im 41/101  soft-tissue]
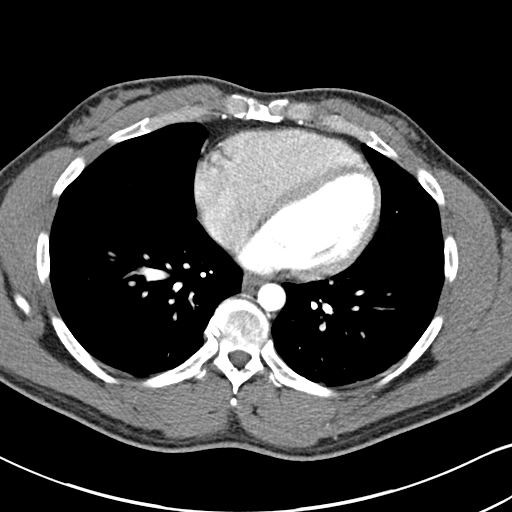
[im 47/101  lung]
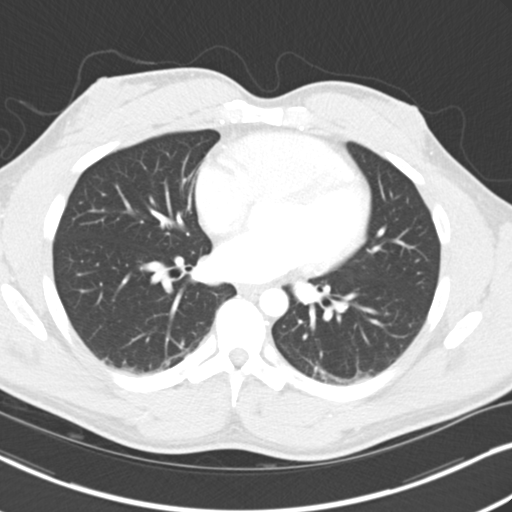
[im 54/101  soft-tissue]
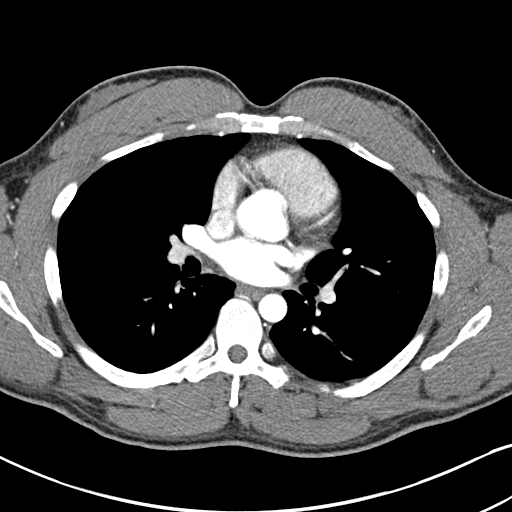
[im 61/101  lung]
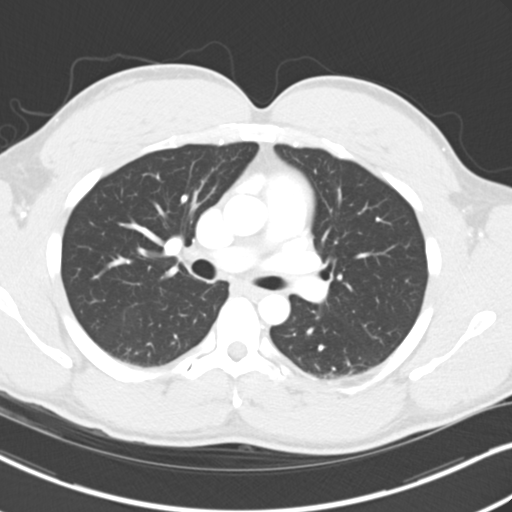
[im 67/101  soft-tissue]
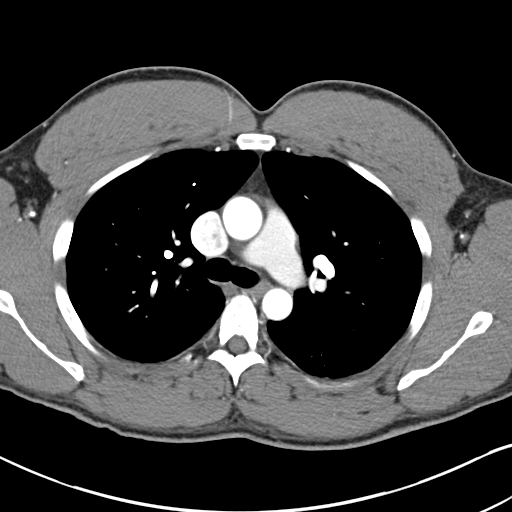
[im 74/101  lung]
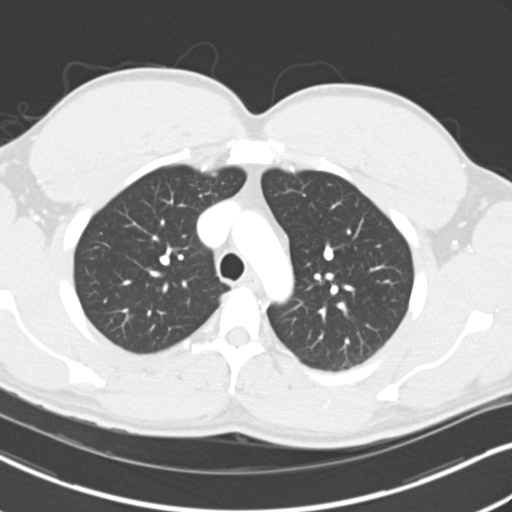
[im 81/101  soft-tissue]
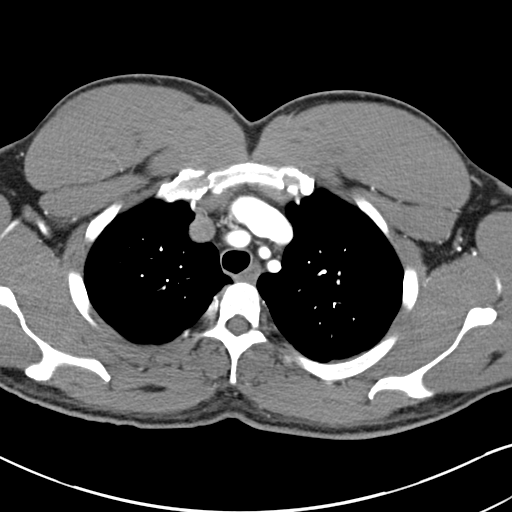
[im 87/101  lung]
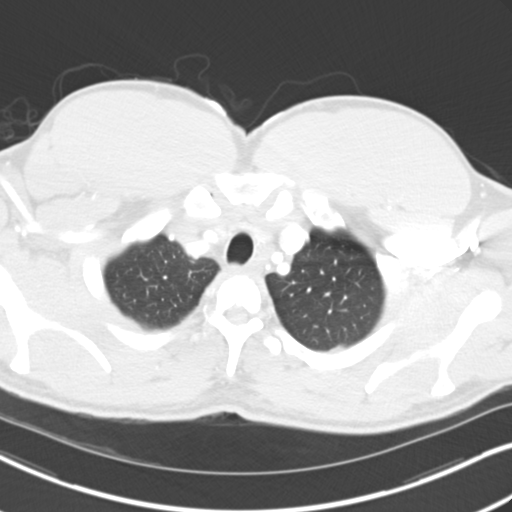
[im 94/101  soft-tissue]
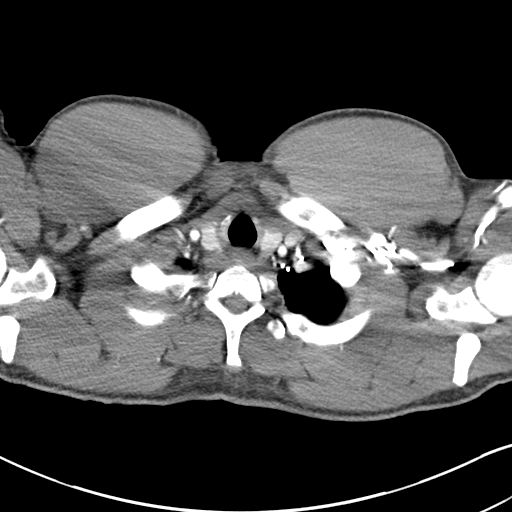

[Series 6: coronal mpr · coronal · 0.63mm/px · 3 of 151 slices shown]
[im 38/151  soft-tissue]
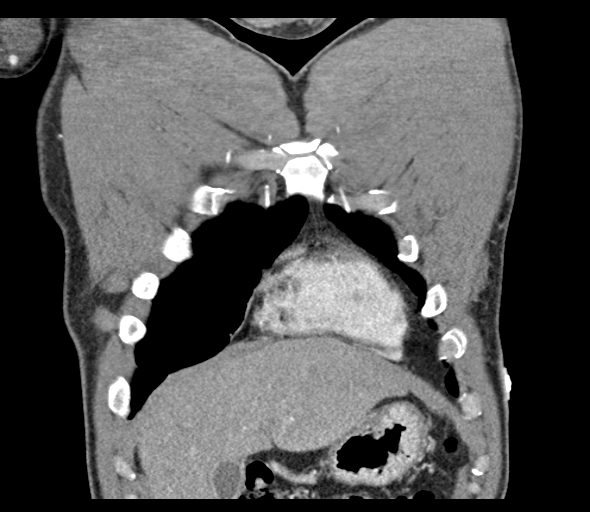
[im 76/151  soft-tissue]
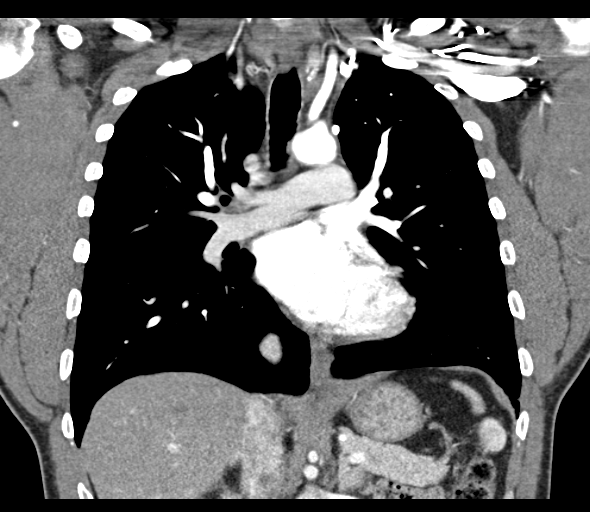
[im 113/151  soft-tissue]
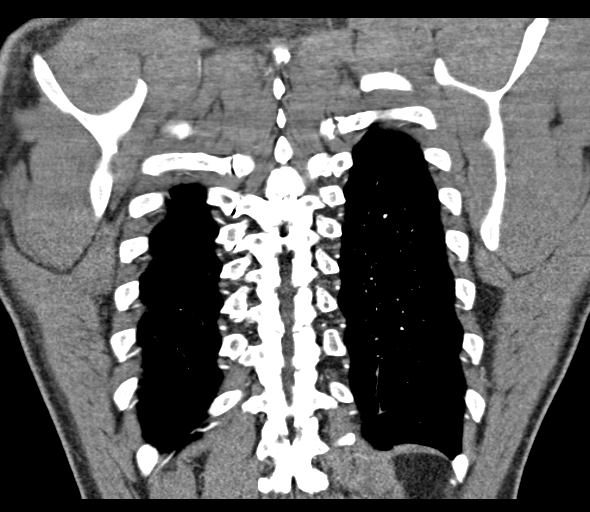

[Series 11: lung windows · axial · 0.68mm/px · z∈[+1953,+1988]mm · 2 of 61 slices shown]
[im 7/61  soft-tissue]
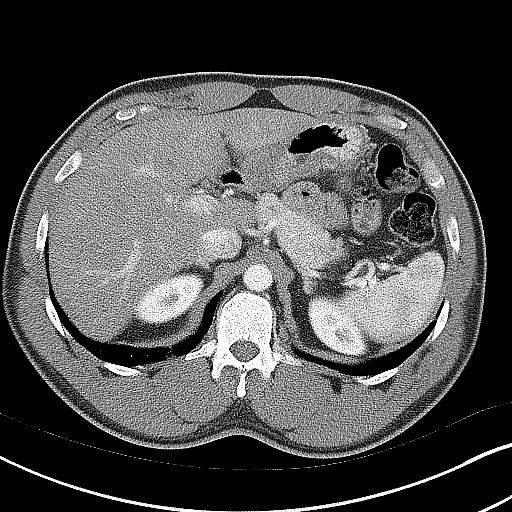
[im 14/61  soft-tissue]
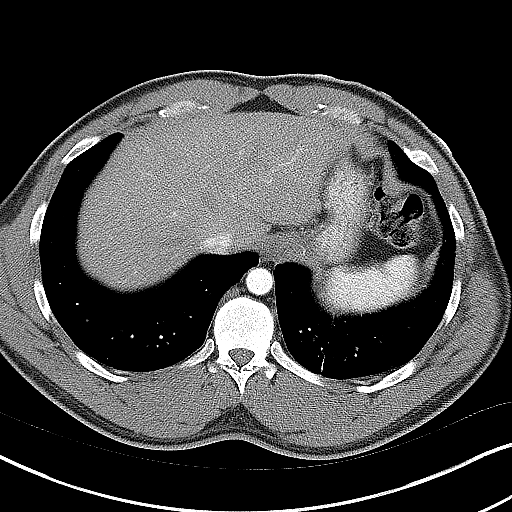

[19 of 46 positions shown; findings below may reference images not displayed]

FINDINGS: The thoracic aorta appears normal. No evidence of aortic dissection
or aneurysm. There are no coronary artery or aortic calcifications.
Heart size is normal. No pericardial effusion. The lungs are clear
except for some minimal dependent atelectasis at the lung bases. No
effusions. No osseous abnormality of significance. The visualized
portion of the upper abdomen is normal. Opacification of the
pulmonary arteries is not adequate for evaluation of for pulmonary
emboli.

Review of the MIP images confirms the above findings.
IMPRESSION: Normal is CT angiogram of the chest. Normal thoracic aorta. Normal
appearing heart. Pulmonary arteries are not adequately opacified to
evaluate for pulmonary emboli.

## 2016-08-02 MED FILL — carBAMazepine 200 MG TABS: 200 | 30 days supply | Qty: 60 | Fill #2

## 2016-08-06 ENCOUNTER — Encounter: Payer: Self-pay | Admitting: Family Medicine

## 2016-08-06 ENCOUNTER — Ambulatory Visit: Payer: Self-pay | Attending: Family Medicine | Admitting: Family Medicine

## 2016-08-06 VITALS — BP 109/71 | HR 96 | Temp 98.1°F | Ht 66.75 in | Wt 203.8 lb

## 2016-08-06 DIAGNOSIS — N183 Chronic kidney disease, stage 3 unspecified: Secondary | ICD-10-CM

## 2016-08-06 DIAGNOSIS — G40909 Epilepsy, unspecified, not intractable, without status epilepticus: Secondary | ICD-10-CM | POA: Insufficient documentation

## 2016-08-06 DIAGNOSIS — Z6832 Body mass index (BMI) 32.0-32.9, adult: Secondary | ICD-10-CM | POA: Insufficient documentation

## 2016-08-06 DIAGNOSIS — E669 Obesity, unspecified: Secondary | ICD-10-CM | POA: Insufficient documentation

## 2016-08-06 LAB — CBC WITH DIFFERENTIAL/PLATELET
Basophils Absolute: 53 cells/uL (ref 0–200)
Basophils Relative: 1 %
EOS PCT: 5 %
Eosinophils Absolute: 265 cells/uL (ref 15–500)
HCT: 43.5 % (ref 38.5–50.0)
Hemoglobin: 14.8 g/dL (ref 13.2–17.1)
LYMPHS PCT: 30 %
Lymphs Abs: 1590 cells/uL (ref 850–3900)
MCH: 30.3 pg (ref 27.0–33.0)
MCHC: 34 g/dL (ref 32.0–36.0)
MCV: 89.1 fL (ref 80.0–100.0)
MONOS PCT: 15 %
MPV: 9 fL (ref 7.5–12.5)
Monocytes Absolute: 795 cells/uL (ref 200–950)
Neutro Abs: 2597 cells/uL (ref 1500–7800)
Neutrophils Relative %: 49 %
PLATELETS: 292 10*3/uL (ref 140–400)
RBC: 4.88 MIL/uL (ref 4.20–5.80)
RDW: 13.4 % (ref 11.0–15.0)
WBC: 5.3 10*3/uL (ref 3.8–10.8)

## 2016-08-06 LAB — COMPLETE METABOLIC PANEL WITH GFR
ALBUMIN: 4.2 g/dL (ref 3.6–5.1)
ALK PHOS: 84 U/L (ref 40–115)
ALT: 17 U/L (ref 9–46)
AST: 22 U/L (ref 10–40)
BILIRUBIN TOTAL: 0.4 mg/dL (ref 0.2–1.2)
BUN: 16 mg/dL (ref 7–25)
CO2: 29 mmol/L (ref 20–31)
Calcium: 9.5 mg/dL (ref 8.6–10.3)
Chloride: 105 mmol/L (ref 98–110)
Creat: 1.48 mg/dL — ABNORMAL HIGH (ref 0.60–1.35)
GFR, EST AFRICAN AMERICAN: 65 mL/min (ref >=60)
GFR, EST NON AFRICAN AMERICAN: 56 mL/min — AB (ref >=60)
Glucose, Bld: 80 mg/dL (ref 65–99)
POTASSIUM: 4.2 mmol/L (ref 3.5–5.3)
Sodium: 140 mmol/L (ref 135–146)
Total Protein: 7.5 g/dL (ref 6.1–8.1)

## 2016-08-06 MED ORDER — CARBAMAZEPINE 200 MG PO TABS: 200.0000 mg | ORAL_TABLET | Freq: Two times a day (BID) | ORAL | 5 refills | Status: DC

## 2016-08-06 NOTE — Patient Instructions (Signed)
Epilepsy °Epilepsy is a condition in which a person has repeated seizures over time. A seizure is a sudden burst of abnormal electrical and chemical activity in the brain. Seizures can cause a change in attention, behavior, or the ability to remain awake and alert (altered mental status). °Epilepsy increases a person's risk of falls, accidents, and injury. It can also lead to complications, including: °· Depression. °· Poor memory. °· Sudden unexplained death in epilepsy (SUDEP). This complication is rare, and its cause is not known. °Most people with epilepsy lead normal lives. °What are the causes? °This condition may be caused by: °· A head injury. °· An injury that happens at birth. °· A high fever during childhood. °· A stroke. °· Bleeding that goes into or around the brain. °· Certain medicines and drugs. °· Having too little oxygen for a long period of time. °· Abnormal brain development. °· Certain infections, such as meningitis and encephalitis. °· Brain tumors. °· Conditions that are passed along from parent to child (are hereditary). °What are the signs or symptoms? °Symptoms of a seizure vary greatly from person to person. They include: °· Convulsions. °· Stiffening of the body. °· Involuntary movements of the arms or legs. °· Loss of consciousness. °· Breathing problems. °· Falling suddenly. °· Confusion. °· Head nodding. °· Eye blinking or fluttering. °· Lip smacking. °· Drooling. °· Rapid eye movements. °· Grunting. °· Loss of bladder control and bowel control. °· Staring. °· Unresponsiveness. °Some people have symptoms right before a seizure happens (aura) and right after a seizure happens. Symptoms of an aura include: °· Fear or anxiety. °· Nausea. °· Feeling like the room is spinning (vertigo). °· A feeling of having seen or heard something before (deja vu). °· Odd tastes or smells. °· Changes in vision, such as seeing flashing lights or spots. °Symptoms that follow a seizure  include: °· Confusion. °· Sleepiness. °· Headache. °How is this diagnosed? °This condition is diagnosed based on: °· Your symptoms. °· Your medical history. °· A physical exam. °· A neurological exam. A neurological exam is similar to a physical exam. It involves checking your strength, reflexes, coordination, and sensations. °· Tests, such as: °¨ An electroencephalogram (EEG). This is a painless test that creates a diagram of your brain waves. °¨ An MRI of the brain. °¨ A CT scan of the brain. °¨ A lumbar puncture, also called a spinal tap. °¨ Blood tests to check for signs of infection or abnormal blood chemistry. °How is this treated? °There is no cure for this condition, but treatment can help control seizures. Treatment may involve: °· Taking medicines to control seizures. These include medicines to prevent seizures and medicines to stop seizures as they occur. °· Having a device called a vagus nerve stimulator implanted in the chest. The device sends electrical impulses to the vagus nerve and to the brain to prevent seizures. This treatment may be recommended if medicines do not help. °· Brain surgery. There are several kinds of surgeries that may be done to stop seizures from happening or to reduce how often seizures happen. °· Having regular blood tests. You may need to have blood tests regularly to check that you are getting the right amount of medicine. °Once this condition has been diagnosed, it is important to begin treatment as soon as possible. For some people, epilepsy eventually goes away. °Follow these instructions at home: °Medicines  ° °· Take over-the-counter and prescription medicines only as told by your health care provider. °·   Avoid any substances that may prevent your medicine from working properly, such as alcohol. °Activity  °· Get enough rest. Lack of sleep can make seizures more likely to occur. °· Follow instructions from your health care provider about driving, swimming, and doing any  other activities that would be dangerous if you had a seizure. °Educating others  °Teach friends and family what to do if you have a seizure. They should: °· Lay you on the ground to prevent a fall. °· Cushion your head and body. °· Loosen any tight clothing around your neck. °· Turn you on your side. If vomiting occurs, this helps keep your airway clear. °· Stay with you until you recover. °· Not hold you down. Holding you down will not stop the seizure. °· Not put anything in your mouth. °· Know whether or not you need emergency care. °General instructions  °· Avoid anything that has ever triggered a seizure for you. °· Keep a seizure diary. Record what you remember about each seizure, especially anything that might have triggered the seizure. °· Keep all follow-up visits as told by your health care provider. This is important. °Contact a health care provider if: °· Your seizure pattern changes. °· You have symptoms of infection or another illness. This might increase your risk of having a seizure. °Get help right away if: °· You have a seizure that does not stop after 5 minutes. °· You have several seizures in a row without a complete recovery in between seizures. °· You have a seizure that makes it harder to breathe. °· You have a seizure that is different from previous seizures. °· You have a seizure that leaves you unable to speak or use a part of your body. °· You did not wake up immediately after a seizure. °This information is not intended to replace advice given to you by your health care provider. Make sure you discuss any questions you have with your health care provider. °Document Released: 05/14/2005 Document Revised: 12/10/2015 Document Reviewed: 11/22/2015 °Elsevier Interactive Patient Education © 2017 [employer] ° °

## 2016-08-06 NOTE — Progress Notes (Signed)
Subjective:  Patient ID: Jason Flowers, male    DOB: November 17, 1970  Age: 46 y.o. 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 He has been compliant with his Carbamazepine and states his last seizure was was 5 months ago.   He works out regularly at [employer] to stay healthy but has not been trying to lose weight. Denies chest pains, shortness of breath or pedal edema and has no other concerns today.  Past Medical History:  Diagnosis Date  . Seizure disorder (HCC)   . Seizures (HCC)    since child     History reviewed. No pertinent surgical history.  No Known Allergies   Outpatient Medications Prior to Visit  Medication Sig Dispense Refill  . carbamazepine (TEGRETOL) 200 MG tablet Take 1 tablet (200 mg total) by mouth 2 (two) times daily. 60 tablet 5   No facility-administered medications prior to visit.     ROS Review of Systems  Constitutional: Negative for activity change and appetite change.  HENT: Negative for sinus pressure and sore throat.   Eyes: Negative for visual disturbance.  Respiratory: Negative for cough, chest tightness and shortness of breath.   Cardiovascular: Negative for chest pain and leg swelling.  Gastrointestinal: Negative for abdominal distention, abdominal pain, constipation and diarrhea.  Endocrine: Negative.   Genitourinary: Negative for dysuria.  Musculoskeletal: Negative for joint swelling and myalgias.  Skin: Negative for rash.  Allergic/Immunologic: Negative.   Neurological: Negative for weakness, light-headedness and numbness.  Psychiatric/Behavioral: Negative for dysphoric mood and suicidal ideas.    Objective:  BP 109/71 (BP Location: Right Arm, Patient Position: Sitting, Cuff Size: Large)   Pulse 96   Temp 98.1 F (36.7 C) (Oral)   Ht 5' 6.75" (1.695 m)    Wt 203 lb 12.8 oz (92.4 kg)   SpO2 99%   BMI 32.16 kg/m   BP/Weight 08/06/2016 05/07/2016 03/12/2016  Systolic BP 109 109 104  Diastolic BP 71 67 68  Wt. (Lbs) 203.8 211.2 206.8  BMI 32.16 35.15 33.89      Physical Exam  Constitutional: He is oriented to person, place, and time. He appears well-developed and well-nourished.  HENT:  Right Ear: External ear normal.  Left Ear: External ear normal.  Cardiovascular: Normal rate, normal heart sounds and intact distal pulses.   No murmur heard. Pulmonary/Chest: Effort normal and breath sounds normal. He has no wheezes. He has no rales. He exhibits no tenderness.  Abdominal: Soft. Bowel sounds are normal. He exhibits no distension and no mass. There is no tenderness.  Musculoskeletal: Normal range of motion.  Neurological: He is alert and oriented to person, place, and time.  Skin: Skin is warm and dry.  Psychiatric: He has a normal mood and affect.     Assessment & Plan:   1. Seizure disorder (HCC) Seizure-free for the last 5 months Advised against driving until 6 months seizure-free - carbamazepine (TEGRETOL) 200 MG tablet; Take 1 tablet (200 mg total) by mouth 2 (two) times daily.  Dispense: 60 tablet; Refill: 5 - Carbamazepine level, total - CBC with Differential/Platelet  2. Stage 3 chronic kidney disease Avoid nephrotoxic agents - COMPLETE METABOLIC PANEL WITH GFR  3. Obesity (BMI 30.0-34.9) Discussed reducing portion sizes, exercising 30 minutes, 5 days a week    Meds ordered this encounter  Medications  .  carbamazepine (TEGRETOL) 200 MG tablet    Sig: Take 1 tablet (200 mg total) by mouth 2 (two) times daily.    Dispense:  60 tablet    Refill:  5    Follow-up: Return in about 3 months (around 11/06/2016) for Follow-up on seizures.   Jaclyn Shaggy MD

## 2016-08-07 LAB — CARBAMAZEPINE LEVEL, TOTAL

## 2016-08-13 ENCOUNTER — Telehealth: Payer: Self-pay | Admitting: Family Medicine

## 2016-08-13 NOTE — Telephone Encounter (Signed)
Patient called the office to follow up on the results of his labs. Please follow up.  Thank you.

## 2016-08-15 NOTE — Telephone Encounter (Signed)
Writer called patient with his results, per Dr. Venetia NightAmao.  Patient stated understanding.

## 2016-08-27 ENCOUNTER — Telehealth: Payer: Self-pay

## 2016-08-27 NOTE — Telephone Encounter (Signed)
-----   Message from Jaclyn Shaggy, MD sent at 08/07/2016 12:02 PM EDT ----- Blood count is normal, Kidney function shows slight improvement; Tegretol level is slightly low but given he has had no seizures I will make no changes to his regimen.

## 2016-08-27 NOTE — Telephone Encounter (Signed)
Spoke with patient last week regarding his lab results.  Patient stated understanding.

## 2016-09-12 MED FILL — carBAMazepine 200 MG TABS: 200 | 30 days supply | Qty: 60 | Fill #0

## 2016-10-25 MED FILL — carBAMazepine 200 MG TABS: 200 | 30 days supply | Qty: 60 | Fill #1

## 2016-11-08 ENCOUNTER — Ambulatory Visit: Payer: Self-pay | Attending: Family Medicine | Admitting: Family Medicine

## 2016-11-08 ENCOUNTER — Encounter: Payer: Self-pay | Admitting: Family Medicine

## 2016-11-08 VITALS — BP 127/69 | HR 87 | Temp 98.5°F | Resp 18 | Ht 64.0 in | Wt 209.6 lb

## 2016-11-08 DIAGNOSIS — N183 Chronic kidney disease, stage 3 unspecified: Secondary | ICD-10-CM

## 2016-11-08 DIAGNOSIS — E669 Obesity, unspecified: Secondary | ICD-10-CM | POA: Insufficient documentation

## 2016-11-08 DIAGNOSIS — Z79899 Other long term (current) drug therapy: Secondary | ICD-10-CM | POA: Insufficient documentation

## 2016-11-08 DIAGNOSIS — G40909 Epilepsy, unspecified, not intractable, without status epilepticus: Secondary | ICD-10-CM | POA: Insufficient documentation

## 2016-11-08 MED ORDER — CARBAMAZEPINE 200 MG PO TABS: 200.0000 mg | ORAL_TABLET | Freq: Two times a day (BID) | ORAL | 6 refills | Status: DC

## 2016-11-08 NOTE — Progress Notes (Signed)
Subjective:  Patient ID: Jason Flowers, male    DOB: 04-02-1971  Age: 46 y.o. 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 He has been compliant with his Carbamazepine and states his last seizure was was close to 9 months ago. He currently drives as he has been greater than 6 months seizure-free   He works out regularly at the gym to stay healthy but has not been trying to lose weight. Denies chest pains, shortness of breath or pedal edema and has no other concerns today. Requesting refill of his carbamazepine.  Past Medical History:  Diagnosis Date  . Seizure disorder ([place])   . Seizures ([place])    since child     History reviewed. No pertinent surgical history.  No Known Allergies   Outpatient Medications Prior to Visit  Medication Sig Dispense Refill  . carbamazepine (TEGRETOL) 200 MG tablet Take 1 tablet (200 mg total) by mouth 2 (two) times daily. 60 tablet 5   No facility-administered medications prior to visit.     ROS Review of Systems  Constitutional: Negative for activity change and appetite change.  HENT: Negative for sinus pressure and sore throat.   Eyes: Negative for visual disturbance.  Respiratory: Negative for cough, chest tightness and shortness of breath.   Cardiovascular: Negative for chest pain and leg swelling.  Gastrointestinal: Negative for abdominal distention, abdominal pain, constipation and diarrhea.  Endocrine: Negative.   Genitourinary: Negative for dysuria.  Musculoskeletal: Negative for joint swelling and myalgias.  Skin: Negative for rash.  Allergic/Immunologic: Negative.   Neurological: Negative for weakness, light-headedness and numbness.  Psychiatric/Behavioral: Negative for dysphoric mood and suicidal ideas.    Objective:  BP 127/69 (BP Location: Left Arm, Patient Position: Sitting,  Cuff Size: Large)   Pulse 87   Temp 98.5 F (36.9 C) (Oral)   Resp 18   Ht '5\' 4"'  (1.626 m)   Wt 209 lb 9.6 oz (95.1 kg)   SpO2 97%   BMI 35.98 kg/m   BP/Weight 11/08/2016 08/06/2016 00/37/0488  Systolic BP 891 694 503  Diastolic BP 69 71 67  Wt. (Lbs) 209.6 203.8 211.2  BMI 35.98 32.16 35.15      Physical Exam  Constitutional: He is oriented to person, place, and time. He appears well-developed and well-nourished.  Cardiovascular: Normal rate, normal heart sounds and intact distal pulses.   No murmur heard. Pulmonary/Chest: Effort normal and breath sounds normal. He has no wheezes. He has no rales. He exhibits no tenderness.  Abdominal: Soft. Bowel sounds are normal. He exhibits no distension and no mass. There is no tenderness.  Musculoskeletal: Normal range of motion.  Neurological: He is alert and oriented to person, place, and time.  Skin: Skin is warm.  Psychiatric: He has a normal mood and affect.     CMP Latest Ref Rng & Units 08/06/2016 05/10/2016 03/12/2016  Glucose 65 - 99 mg/dL 80 88 75  BUN 7 - 25 mg/dL '16 16 16  ' Creatinine 0.60 - 1.35 mg/dL 1.48(H) 1.63(H) 1.43(H)  Sodium 135 - 146 mmol/L 140 139 141  Potassium 3.5 - 5.3 mmol/L 4.2 4.3 4.2  Chloride 98 - 110 mmol/L 105 105 105  CO2 20 - 31 mmol/L '29 28 23  ' Calcium 8.6 - 10.3 mg/dL 9.5 9.1 9.6  Total Protein 6.1 - 8.1 g/dL 7.5 7.1 7.5  Total  Bilirubin 0.2 - 1.2 mg/dL 0.4 0.4 0.2  Alkaline Phos 40 - 115 U/L 84 68 90  AST 10 - 40 U/L '22 28 25  ' ALT 9 - 46 U/L '17 20 19    ' Assessment & Plan:   1. Seizure disorder ([place]) No seizures since last year He has been driving as he has been seizure free for greater than 6 months Educated on [place] driving laws re-: No driving until 6 months seizure-free - carbamazepine (TEGRETOL) 200 MG tablet; Take 1 tablet (200 mg total) by mouth 2 (two) times daily.  Dispense: 60 tablet; Refill: 6  2. Stage 3 chronic kidney disease Last creatinine was 1.48 Avoid nephrotoxins -  CMP14+EGFR   Meds ordered this encounter  Medications  . carbamazepine (TEGRETOL) 200 MG tablet    Sig: Take 1 tablet (200 mg total) by mouth 2 (two) times daily.    Dispense:  60 tablet    Refill:  6    Follow-up: Return in about 6 months (around 05/10/2017) for Follow-up on seizures.   Arnoldo Morale MD

## 2016-11-08 NOTE — Patient Instructions (Signed)
Epilepsy °Epilepsy is a condition in which a person has repeated seizures over time. A seizure is a sudden burst of abnormal electrical and chemical activity in the brain. Seizures can cause a change in attention, behavior, or the ability to remain awake and alert (altered mental status). °Epilepsy increases a person's risk of falls, accidents, and injury. It can also lead to complications, including: °· Depression. °· Poor memory. °· Sudden unexplained death in epilepsy (SUDEP). This complication is rare, and its cause is not known. ° °Most people with epilepsy lead normal lives. °What are the causes? °This condition may be caused by: °· A head injury. °· An injury that happens at birth. °· A high fever during childhood. °· A stroke. °· Bleeding that goes into or around the brain. °· Certain medicines and drugs. °· Having too little oxygen for a long period of time. °· Abnormal brain development. °· Certain infections, such as meningitis and encephalitis. °· Brain tumors. °· Conditions that are passed along from parent to child (are hereditary). ° °What are the signs or symptoms? °Symptoms of a seizure vary greatly from person to person. They include: °· Convulsions. °· Stiffening of the body. °· Involuntary movements of the arms or legs. °· Loss of consciousness. °· Breathing problems. °· Falling suddenly. °· Confusion. °· Head nodding. °· Eye blinking or fluttering. °· Lip smacking. °· Drooling. °· Rapid eye movements. °· Grunting. °· Loss of bladder control and bowel control. °· Staring. °· Unresponsiveness. ° °Some people have symptoms right before a seizure happens (aura) and right after a seizure happens. Symptoms of an aura include: °· Fear or anxiety. °· Nausea. °· Feeling like the room is spinning (vertigo). °· A feeling of having seen or heard something before (deja vu). °· Odd tastes or smells. °· Changes in vision, such as seeing flashing lights or spots. ° °Symptoms that follow a seizure  include: °· Confusion. °· Sleepiness. °· Headache. ° °How is this diagnosed? °This condition is diagnosed based on: °· Your symptoms. °· Your medical history. °· A physical exam. °· A neurological exam. A neurological exam is similar to a physical exam. It involves checking your strength, reflexes, coordination, and sensations. °· Tests, such as: °? An electroencephalogram (EEG). This is a painless test that creates a diagram of your brain waves. °? An MRI of the brain. °? A CT scan of the brain. °? A lumbar puncture, also called a spinal tap. °? Blood tests to check for signs of infection or abnormal blood chemistry. ° °How is this treated? °There is no cure for this condition, but treatment can help control seizures. Treatment may involve: °· Taking medicines to control seizures. These include medicines to prevent seizures and medicines to stop seizures as they occur. °· Having a device called a vagus nerve stimulator implanted in the chest. The device sends electrical impulses to the vagus nerve and to the brain to prevent seizures. This treatment may be recommended if medicines do not help. °· Brain surgery. There are several kinds of surgeries that may be done to stop seizures from happening or to reduce how often seizures happen. °· Having regular blood tests. You may need to have blood tests regularly to check that you are getting the right amount of medicine. ° °Once this condition has been diagnosed, it is important to begin treatment as soon as possible. For some people, epilepsy eventually goes away. °Follow these instructions at home: °Medicines ° °· Take over-the-counter and prescription medicines only as   told by your health care provider. °· Avoid any substances that may prevent your medicine from working properly, such as alcohol. °Activity °· Get enough rest. Lack of sleep can make seizures more likely to occur. °· Follow instructions from your health care provider about driving, swimming, and doing  any other activities that would be dangerous if you had a seizure. °Educating others °Teach friends and family what to do if you have a seizure. They should: °· Lay you on the ground to prevent a fall. °· Cushion your head and body. °· Loosen any tight clothing around your neck. °· Turn you on your side. If vomiting occurs, this helps keep your airway clear. °· Stay with you until you recover. °· Not hold you down. Holding you down will not stop the seizure. °· Not put anything in your mouth. °· Know whether or not you need emergency care. ° °General instructions °· Avoid anything that has ever triggered a seizure for you. °· Keep a seizure diary. Record what you remember about each seizure, especially anything that might have triggered the seizure. °· Keep all follow-up visits as told by your health care provider. This is important. °Contact a health care provider if: °· Your seizure pattern changes. °· You have symptoms of infection or another illness. This might increase your risk of having a seizure. °Get help right away if: °· You have a seizure that does not stop after 5 minutes. °· You have several seizures in a row without a complete recovery in between seizures. °· You have a seizure that makes it harder to breathe. °· You have a seizure that is different from previous seizures. °· You have a seizure that leaves you unable to speak or use a part of your body. °· You did not wake up immediately after a seizure. °This information is not intended to replace advice given to you by your health care provider. Make sure you discuss any questions you have with your health care provider. °Document Released: 05/14/2005 Document Revised: 12/10/2015 Document Reviewed: 11/22/2015 °Elsevier Interactive Patient Education © 2018 [employer] ° °

## 2016-11-09 LAB — CMP14+EGFR
ALBUMIN: 4.2 g/dL (ref 3.5–5.5)
ALK PHOS: 89 IU/L (ref 39–117)
ALT: 19 IU/L (ref 0–44)
AST: 34 IU/L (ref 0–40)
Albumin/Globulin Ratio: 1.4 (ref 1.2–2.2)
BUN/Creatinine Ratio: 11 (ref 9–20)
BUN: 14 mg/dL (ref 6–24)
CHLORIDE: 106 mmol/L (ref 96–106)
CO2: 22 mmol/L (ref 20–29)
CREATININE: 1.33 mg/dL — AB (ref 0.76–1.27)
Calcium: 9.4 mg/dL (ref 8.7–10.2)
GFR calc non Af Amer: 64 mL/min/{1.73_m2} (ref >59)
GFR, EST AFRICAN AMERICAN: 74 mL/min/{1.73_m2} (ref >59)
GLUCOSE: 76 mg/dL (ref 65–99)
Globulin, Total: 2.9 g/dL (ref 1.5–4.5)
Potassium: 4.2 mmol/L (ref 3.5–5.2)
Sodium: 143 mmol/L (ref 134–144)
TOTAL PROTEIN: 7.1 g/dL (ref 6.0–8.5)

## 2016-11-22 ENCOUNTER — Telehealth: Payer: Self-pay | Admitting: *Deleted

## 2016-11-22 NOTE — Telephone Encounter (Signed)
Patient verified DOB Patient is aware of kidney function being slightly improved. Patient expressed his understanding and had no further questions.

## 2016-11-22 NOTE — Telephone Encounter (Signed)
-----   Message from Jaclyn ShaggyEnobong Amao, MD sent at 11/09/2016  1:48 PM EDT ----- Kidney function has improved slightly

## 2016-11-29 MED FILL — carBAMazepine 200 MG TABS: 200 | 30 days supply | Qty: 60 | Fill #2

## 2017-01-01 MED FILL — carBAMazepine 200 MG TABS: 200 | 30 days supply | Qty: 60 | Fill #3

## 2017-02-07 MED FILL — carBAMazepine 200 MG TABS: 200 | 30 days supply | Qty: 60 | Fill #4

## 2017-03-18 MED FILL — carBAMazepine 200 MG TABS: 200 | 30 days supply | Qty: 60 | Fill #5

## 2017-04-29 MED FILL — carBAMazepine 200 MG TABS: 200 | 30 days supply | Qty: 60 | Fill #0

## 2017-05-09 ENCOUNTER — Encounter: Payer: Self-pay | Admitting: Family Medicine

## 2017-05-09 ENCOUNTER — Ambulatory Visit: Payer: Self-pay | Attending: Family Medicine | Admitting: Family Medicine

## 2017-05-09 VITALS — BP 119/71 | HR 76 | Temp 97.7°F | Ht 64.0 in | Wt 202.8 lb

## 2017-05-09 DIAGNOSIS — E669 Obesity, unspecified: Secondary | ICD-10-CM | POA: Insufficient documentation

## 2017-05-09 DIAGNOSIS — N183 Chronic kidney disease, stage 3 (moderate): Secondary | ICD-10-CM | POA: Insufficient documentation

## 2017-05-09 DIAGNOSIS — Z8042 Family history of malignant neoplasm of prostate: Secondary | ICD-10-CM

## 2017-05-09 DIAGNOSIS — E889 Metabolic disorder, unspecified: Secondary | ICD-10-CM | POA: Insufficient documentation

## 2017-05-09 DIAGNOSIS — Z23 Encounter for immunization: Secondary | ICD-10-CM

## 2017-05-09 DIAGNOSIS — Z13228 Encounter for screening for other metabolic disorders: Secondary | ICD-10-CM

## 2017-05-09 DIAGNOSIS — G40909 Epilepsy, unspecified, not intractable, without status epilepticus: Secondary | ICD-10-CM

## 2017-05-09 DIAGNOSIS — Z6834 Body mass index (BMI) 34.0-34.9, adult: Secondary | ICD-10-CM | POA: Insufficient documentation

## 2017-05-09 DIAGNOSIS — R5383 Other fatigue: Secondary | ICD-10-CM

## 2017-05-09 MED ORDER — CARBAMAZEPINE 200 MG PO TABS: 200.0000 mg | ORAL_TABLET | Freq: Two times a day (BID) | ORAL | 6 refills | Status: DC

## 2017-05-09 NOTE — Progress Notes (Signed)
Subjective:  Patient ID: Jason Flowers, male    DOB: 1970/07/17  Age: 46 y.o. 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  Last serum creatinine was 1.33 and this has improved over time from a high of 1.63  His last seizure was one year ago and he has been compliant with carbamazepine.  He denies headaches, shortness of breath or chest pains.  He would like to have a testosterone level checked as he complains of fatigue and low energy even with minimal exertion but denies shortness of breath or chest pains.  Also requests a PSA check as his father died of prostate cancer at the age of 60.  He is unsure if he has genetic predisposition to cancer as his sister was recently diagnosed with breast cancer and 20 years ago did have a history of 'cancer behind her ear'. I have informed him to obtain a more detailed family history as well as to determine if we would need to refer him for genetic testing.  Past Medical History:  Diagnosis Date  . Seizure disorder ([place])   . Seizures ([place])    since child     History reviewed. No pertinent surgical history.  No Known Allergies   Outpatient Medications Prior to Visit  Medication Sig Dispense Refill  . carbamazepine (TEGRETOL) 200 MG tablet Take 1 tablet (200 mg total) by mouth 2 (two) times daily. 60 tablet 6   No facility-administered medications prior to visit.     ROS Review of Systems  Constitutional: Negative for activity change and appetite change.  HENT: Negative for sinus pressure and sore throat.   Eyes: Negative for visual disturbance.  Respiratory: Negative for cough, chest tightness and shortness of breath.   Cardiovascular: Negative for chest pain and leg swelling.  Gastrointestinal: Negative for abdominal distention, abdominal pain, constipation and diarrhea.  Endocrine:  Negative.   Genitourinary: Negative for dysuria.  Musculoskeletal: Negative for joint swelling and myalgias.  Skin: Negative for rash.  Allergic/Immunologic: Negative.   Neurological: Negative for weakness, light-headedness and numbness.  Psychiatric/Behavioral: Negative for dysphoric mood and suicidal ideas.    Objective:  BP 119/71   Pulse 76   Temp 97.7 F (36.5 C) (Oral)   Ht _0  (1.626 m)   Wt 202 lb 12.8 oz (92 kg)   SpO2 98%   BMI 34.81 kg/m   BP/Weight 05/09/2017 11/08/2016 11/21/9483  Systolic BP 462 703 500  Diastolic BP 71 69 71  Wt. (Lbs) 202.8 209.6 203.8  BMI 34.81 35.98 32.16      Physical Exam  Constitutional: He is oriented to person, place, and time. He appears well-developed and well-nourished.  Cardiovascular: Normal rate, normal heart sounds and intact distal pulses.  No murmur heard. Pulmonary/Chest: Effort normal and breath sounds normal. He has no wheezes. He has no rales. He exhibits no tenderness.  Abdominal: Soft. Bowel sounds are normal. He exhibits no distension and no mass. There is no tenderness.  Musculoskeletal: Normal range of motion.  Neurological: He is alert and oriented to person, place, and time.  Skin: Skin is warm and dry.  Psychiatric: He has a normal mood and affect.    CMP Latest Ref Rng & Units 11/08/2016 08/06/2016 05/10/2016  Glucose 65 - 99 mg/dL 76 80 88  BUN 6 - 24 mg/dL _1 Creatinine  0.76 - 1.27 mg/dL 1.33(H) 1.48(H) 1.63(H)  Sodium 134 - 144 mmol/L 143 140 139  Potassium 3.5 - 5.2 mmol/L 4.2 4.2 4.3  Chloride 96 - 106 mmol/L 106 105 105  CO2 20 - 29 mmol/L _0 Calcium 8.7 - 10.2 mg/dL 9.4 9.5 9.1  Total Protein 6.0 - 8.5 g/dL 7.1 7.5 7.1  Total Bilirubin 0.0 - 1.2 mg/dL <0.2 0.4 0.4  Alkaline Phos 39 - 117 IU/L 89 84 68  AST 0 - 40 IU/L 34 22 28  ALT 0 - 44 IU/L _1 Assessment & Plan:   1. Seizure disorder ([place]) No seizures in the last 1 year - carbamazepine (TEGRETOL) 200 MG tablet;  Take 1 tablet (200 mg total) by mouth 2 (two) times daily.  Dispense: 60 tablet; Refill: 6 - CBC with Differential/Platelet - Carbamazepine, Free and Total; Future  2. Need for influenza vaccination - Flu Vaccine QUAD 36+ mos IM  3. Screening for metabolic disorder - [id number]; Future - Lipid panel; Future  4. Family history of prostate cancer in father - PSA, total and free; Future  5. Other fatigue We have discussed risks and benefits of testosterone replacement therapy - Testosterone,Free and Total; Future   Meds ordered this encounter  Medications  . carbamazepine (TEGRETOL) 200 MG tablet    Sig: Take 1 tablet (200 mg total) by mouth 2 (two) times daily.    Dispense:  60 tablet    Refill:  6    Follow-up: Return in about 6 months (around 11/07/2017) for follow up on seizures.   Arnoldo Morale MD

## 2017-05-09 NOTE — Patient Instructions (Signed)
Seizure, Adult °When you have a seizure: °· Parts of your body may move. °· How aware or awake (conscious) you are may change. °· You may shake (convulse). ° °Some people have symptoms right before a seizure happens. These symptoms may include: °· Fear. °· Worry (anxiety). °· Feeling like you are going to throw up (nausea). °· Feeling like the room is spinning (vertigo). °· Feeling like you saw or heard something before (deja vu). °· Odd tastes or smells. °· Changes in vision, such as seeing flashing lights or spots. ° °Seizures usually last from 30 seconds to 2 minutes. Usually, they are not harmful unless they last a long time. °Follow these instructions at home: °Medicines °· Take over-the-counter and prescription medicines only as told by your doctor. °· Avoid anything that may keep your medicine from working, such as alcohol. °Activity °· Do not do any activities that would be dangerous if you had another seizure, like driving or swimming. Wait until your doctor approves. °· If you live in the [place] ask your local DMV (department of motor vehicles) when you can drive. °· Rest. °Teaching others °· Teach friends and family what to do when you have a seizure. They should: °? Lay you on the ground. °? Protect your head and body. °? Loosen any tight clothing around your neck. °? Turn you on your side. °? Stay with you until you are better. °? Not hold you down. °? Not put anything in your mouth. °? Know whether or not you need emergency care. °General instructions °· Contact your doctor each time you have a seizure. °· Avoid anything that gives you seizures. °· Keep a seizure diary. Write down: °? What you think caused each seizure. °? What you remember about each seizure. °· Keep all follow-up visits as told by your doctor. This is important. °Contact a doctor if: °· You have another seizure. °· You have seizures more often. °· There is any change in what happens during your seizures. °· You continue to have  seizures with treatment. °· You have symptoms of being sick or having an infection. °Get help right away if: °· You have a seizure: °? That lasts longer than 5 minutes. °? That is different than seizures you had before. °? That makes it harder to breathe. °? After you hurt your head. °· After a seizure, you cannot speak or use a part of your body. °· After a seizure, you are confused or have a bad headache. °· You have two or more seizures in a row. °· You are having seizures more often. °· You do not wake up right after a seizure. °· You get hurt during a seizure. °In an emergency: °· These symptoms may be an emergency. Do not wait to see if the symptoms will go away. Get medical help right away. Call your local emergency services (911 in the [place] Do not drive yourself to the hospital. °This information is not intended to replace advice given to you by your health care provider. Make sure you discuss any questions you have with your health care provider. °Document Released: 10/31/2007 Document Revised: 01/25/2016 Document Reviewed: 01/25/2016 °Elsevier Interactive Patient Education © 2017 Else[employer] Inc. ° °

## 2017-05-10 ENCOUNTER — Ambulatory Visit: Payer: Self-pay | Attending: Family Medicine

## 2017-05-10 DIAGNOSIS — Z8042 Family history of malignant neoplasm of prostate: Secondary | ICD-10-CM | POA: Insufficient documentation

## 2017-05-10 DIAGNOSIS — R5383 Other fatigue: Secondary | ICD-10-CM | POA: Insufficient documentation

## 2017-05-10 DIAGNOSIS — G40909 Epilepsy, unspecified, not intractable, without status epilepticus: Secondary | ICD-10-CM | POA: Insufficient documentation

## 2017-05-10 DIAGNOSIS — Z13228 Encounter for screening for other metabolic disorders: Secondary | ICD-10-CM | POA: Insufficient documentation

## 2017-05-10 NOTE — Progress Notes (Signed)
Patient  Here for lab visit only

## 2017-05-12 LAB — PSA, TOTAL AND FREE
PROSTATE SPECIFIC AG, SERUM: 0.9 ng/mL (ref 0.0–4.0)
PSA FREE: 0.24 ng/mL
PSA, Free Pct: 26.7 %

## 2017-05-12 LAB — TESTOSTERONE,FREE AND TOTAL
TESTOSTERONE FREE: 10.2 pg/mL (ref 6.8–21.5)
Testosterone: 869 ng/dL (ref 264–916)

## 2017-05-12 LAB — CMP14+EGFR
ALT: 22 IU/L (ref 0–44)
AST: 33 IU/L (ref 0–40)
Albumin/Globulin Ratio: 1.6 (ref 1.2–2.2)
Albumin: 4.4 g/dL (ref 3.5–5.5)
Alkaline Phosphatase: 84 IU/L (ref 39–117)
BUN/Creatinine Ratio: 9 (ref 9–20)
BUN: 15 mg/dL (ref 6–24)
Bilirubin Total: 0.5 mg/dL (ref 0.0–1.2)
CALCIUM: 9.6 mg/dL (ref 8.7–10.2)
CO2: 24 mmol/L (ref 20–29)
CREATININE: 1.65 mg/dL — AB (ref 0.76–1.27)
Chloride: 102 mmol/L (ref 96–106)
GFR calc Af Amer: 56 mL/min/{1.73_m2} — ABNORMAL LOW (ref >59)
GFR, EST NON AFRICAN AMERICAN: 49 mL/min/{1.73_m2} — AB (ref >59)
GLOBULIN, TOTAL: 2.8 g/dL (ref 1.5–4.5)
Glucose: 86 mg/dL (ref 65–99)
Potassium: 4.2 mmol/L (ref 3.5–5.2)
Sodium: 139 mmol/L (ref 134–144)
TOTAL PROTEIN: 7.2 g/dL (ref 6.0–8.5)

## 2017-05-12 LAB — CARBAMAZEPINE, FREE AND TOTAL
CARBAMAZEPINE FREE: 1.3 ug/mL (ref 0.6–4.2)
Carbamazepine, Total: 5.8 ug/mL (ref 4.0–12.0)

## 2017-05-12 LAB — LIPID PANEL
Chol/HDL Ratio: 2.9 ratio (ref 0.0–5.0)
Cholesterol, Total: 160 mg/dL (ref 100–199)
HDL: 56 mg/dL (ref >39)
LDL CALC: 92 mg/dL (ref 0–99)
TRIGLYCERIDES: 59 mg/dL (ref 0–149)
VLDL Cholesterol Cal: 12 mg/dL (ref 5–40)

## 2017-05-14 ENCOUNTER — Telehealth: Payer: Self-pay

## 2017-05-14 NOTE — Telephone Encounter (Signed)
Pt was called and the phone continued to disconnect.

## 2017-05-14 NOTE — Telephone Encounter (Signed)
Pt called back to get the lab result, please follow up

## 2017-05-14 NOTE — Progress Notes (Signed)
Patient came into office to get lab results.

## 2017-06-05 MED FILL — carBAMazepine 200 MG TABS: 200 | 30 days supply | Qty: 60 | Fill #1

## 2017-07-22 ENCOUNTER — Ambulatory Visit: Payer: Self-pay | Attending: Family Medicine

## 2017-07-26 MED FILL — carBAMazepine 200 MG TABS: 200 | 30 days supply | Qty: 60 | Fill #2

## 2017-09-05 MED FILL — carBAMazepine 200 MG TABS: 200 | 30 days supply | Qty: 60 | Fill #3

## 2017-10-14 MED FILL — carBAMazepine 200 MG TABS: 200 | 30 days supply | Qty: 60 | Fill #4

## 2017-11-26 MED FILL — carBAMazepine 200 MG TABS: 200 | 30 days supply | Qty: 60 | Fill #0

## 2017-12-11 ENCOUNTER — Ambulatory Visit: Payer: Self-pay | Admitting: Family Medicine

## 2017-12-13 ENCOUNTER — Telehealth: Payer: Self-pay

## 2017-12-13 DIAGNOSIS — G40909 Epilepsy, unspecified, not intractable, without status epilepticus: Secondary | ICD-10-CM

## 2017-12-13 MED ORDER — CARBAMAZEPINE 200 MG PO TABS
200.0000 mg | ORAL_TABLET | Freq: Two times a day (BID) | ORAL | 1 refills | Status: DC
Start: 2017-12-13 — End: 2018-01-22

## 2017-12-13 NOTE — Telephone Encounter (Signed)
Patient called and would like a refill on (TEGRETOL) 200 MG tablet [[id number]][[id number]]

## 2017-12-16 NOTE — Telephone Encounter (Signed)
Patient was called and informed of doctors orders. Patient has been placed on the waiting list for any cancellations.

## 2017-12-16 NOTE — Telephone Encounter (Signed)
He needs an office visit.  

## 2018-01-22 ENCOUNTER — Encounter

## 2018-01-22 ENCOUNTER — Encounter: Payer: Self-pay | Admitting: Family Medicine

## 2018-01-22 ENCOUNTER — Ambulatory Visit: Payer: Self-pay | Attending: Family Medicine | Admitting: Family Medicine

## 2018-01-22 VITALS — BP 128/80 | HR 68 | Temp 97.6°F | Ht 64.0 in | Wt 195.2 lb

## 2018-01-22 DIAGNOSIS — N182 Chronic kidney disease, stage 2 (mild): Secondary | ICD-10-CM | POA: Insufficient documentation

## 2018-01-22 DIAGNOSIS — G40909 Epilepsy, unspecified, not intractable, without status epilepticus: Secondary | ICD-10-CM | POA: Insufficient documentation

## 2018-01-22 DIAGNOSIS — Z09 Encounter for follow-up examination after completed treatment for conditions other than malignant neoplasm: Secondary | ICD-10-CM | POA: Insufficient documentation

## 2018-01-22 DIAGNOSIS — Z79899 Other long term (current) drug therapy: Secondary | ICD-10-CM | POA: Insufficient documentation

## 2018-01-22 DIAGNOSIS — Z6833 Body mass index (BMI) 33.0-33.9, adult: Secondary | ICD-10-CM | POA: Insufficient documentation

## 2018-01-22 DIAGNOSIS — Z23 Encounter for immunization: Secondary | ICD-10-CM | POA: Insufficient documentation

## 2018-01-22 DIAGNOSIS — E669 Obesity, unspecified: Secondary | ICD-10-CM | POA: Insufficient documentation

## 2018-01-22 MED ORDER — CARBAMAZEPINE 200 MG PO TABS: 200.0000 mg | ORAL_TABLET | Freq: Two times a day (BID) | ORAL | 6 refills | Status: DC

## 2018-01-22 NOTE — Patient Instructions (Signed)
Seizure, Adult °When you have a seizure: °· Parts of your body may move. °· How aware or awake (conscious) you are may change. °· You may shake (convulse). ° °Some people have symptoms right before a seizure happens. These symptoms may include: °· Fear. °· Worry (anxiety). °· Feeling like you are going to throw up (nausea). °· Feeling like the room is spinning (vertigo). °· Feeling like you saw or heard something before (deja vu). °· Odd tastes or smells. °· Changes in vision, such as seeing flashing lights or spots. ° °Seizures usually last from 30 seconds to 2 minutes. Usually, they are not harmful unless they last a long time. °Follow these instructions at home: °Medicines °· Take over-the-counter and prescription medicines only as told by your doctor. °· Avoid anything that may keep your medicine from working, such as alcohol. °Activity °· Do not do any activities that would be dangerous if you had another seizure, like driving or swimming. Wait until your doctor approves. °· If you live in the [place] ask your local DMV (department of motor vehicles) when you can drive. °· Rest. °Teaching others °· Teach friends and family what to do when you have a seizure. They should: °? Lay you on the ground. °? Protect your head and body. °? Loosen any tight clothing around your neck. °? Turn you on your side. °? Stay with you until you are better. °? Not hold you down. °? Not put anything in your mouth. °? Know whether or not you need emergency care. °General instructions °· Contact your doctor each time you have a seizure. °· Avoid anything that gives you seizures. °· Keep a seizure diary. Write down: °? What you think caused each seizure. °? What you remember about each seizure. °· Keep all follow-up visits as told by your doctor. This is important. °Contact a doctor if: °· You have another seizure. °· You have seizures more often. °· There is any change in what happens during your seizures. °· You continue to have  seizures with treatment. °· You have symptoms of being sick or having an infection. °Get help right away if: °· You have a seizure: °? That lasts longer than 5 minutes. °? That is different than seizures you had before. °? That makes it harder to breathe. °? After you hurt your head. °· After a seizure, you cannot speak or use a part of your body. °· After a seizure, you are confused or have a bad headache. °· You have two or more seizures in a row. °· You are having seizures more often. °· You do not wake up right after a seizure. °· You get hurt during a seizure. °In an emergency: °· These symptoms may be an emergency. Do not wait to see if the symptoms will go away. Get medical help right away. Call your local emergency services (911 in the [place] Do not drive yourself to the hospital. °This information is not intended to replace advice given to you by your health care provider. Make sure you discuss any questions you have with your health care provider. °Document Released: 10/31/2007 Document Revised: 01/25/2016 Document Reviewed: 01/25/2016 °Elsevier Interactive Patient Education © 2017 Else[employer] Inc. ° °

## 2018-01-22 NOTE — Progress Notes (Signed)
Subjective:  Patient ID: Jason Flowers, male    DOB: 14-Jan-1971  Age: 47 y.o. 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  His last seizure was 1-1/2 years ago and he denies any headaches, blurry vision, nausea or vomiting. He has been compliant with Tegretol and denies any problems with it. His last creatinine was 1.65 and review of his chart indicates over the last 6 years his creatinine has fluctuated from a minimum of 1.3 to maximum of 1.7 with his highest creatinine noted in 2012. 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  Past Medical History:  Diagnosis Date  . Seizure disorder ([place])   . Seizures ([place])    since child     History reviewed. No pertinent surgical history.  No Known Allergies   Outpatient Medications Prior to Visit  Medication Sig Dispense Refill  . carbamazepine (TEGRETOL) 200 MG tablet Take 1 tablet (200 mg total) by mouth 2 (two) times daily. 60 tablet 1   No facility-administered medications prior to visit.     ROS Review of Systems  Constitutional: Negative for activity change and appetite change.  HENT: Negative for sinus pressure and sore throat.   Eyes: Negative for visual disturbance.  Respiratory: Negative for cough, chest tightness and shortness of breath.   Cardiovascular: Negative for chest pain and leg swelling.  Gastrointestinal: Negative for abdominal distention, abdominal pain, constipation and diarrhea.  Endocrine: Negative.   Genitourinary: Negative for dysuria.  Musculoskeletal: Negative for joint swelling and myalgias.  Skin: Negative for rash.  Allergic/Immunologic: Negative.   Neurological: Negative for weakness, light-headedness and numbness.  Psychiatric/Behavioral: Negative for dysphoric mood and suicidal ideas.    Objective:  BP 128/80   Pulse 68   Temp 97.6 F  (36.4 C) (Oral)   Ht '5\' 4"'  (1.626 m)   Wt 195 lb 3.2 oz (88.5 kg)   SpO2 97%   BMI 33.51 kg/m   BP/Weight 01/22/2018 05/09/2017 09/21/621  Systolic BP 762 831 517  Diastolic BP 80 71 69  Wt. (Lbs) 195.2 202.8 209.6  BMI 33.51 34.81 35.98      Physical Exam  Constitutional: He is oriented to person, place, and time. He appears well-developed and well-nourished.  Cardiovascular: Normal rate, normal heart sounds and intact distal pulses.  No murmur heard. Pulmonary/Chest: Effort normal and breath sounds normal. He has no wheezes. He has no rales. He exhibits no tenderness.  Abdominal: Soft. Bowel sounds are normal. He exhibits no distension and no mass. There is no tenderness.  Musculoskeletal: Normal range of motion.  Neurological: He is alert and oriented to person, place, and time.  Skin: Skin is warm and dry.  Psychiatric: He has a normal mood and affect.    CMP Latest Ref Rng & Units 05/10/2017 11/08/2016 08/06/2016  Glucose 65 - 99 mg/dL 86 76 80  BUN 6 - 24 mg/dL '15 14 16  ' Creatinine 0.76 - 1.27 mg/dL 1.65(H) 1.33(H) 1.48(H)  Sodium 134 - 144 mmol/L 139 143 140  Potassium 3.5 - 5.2 mmol/L 4.2 4.2 4.2  Chloride 96 - 106 mmol/L 102 106 105  CO2 20 - 29 mmol/L '24 22 29  ' Calcium 8.7 - 10.2 mg/dL 9.6 9.4 9.5  Total Protein 6.0 - 8.5 g/dL 7.2 7.1 7.5  Total Bilirubin 0.0 - 1.2 mg/dL 0.5 <0.2 0.4  Alkaline Phos 39 - 117 IU/L 84 89 84  AST 0 - 40 IU/L 33 34 22  ALT 0 - 44 IU/L '22 19 17    ' Lipid Panel     Component Value Date/Time   CHOL 160 05/10/2017 0915   TRIG 59 05/10/2017 0915   HDL 56 05/10/2017 0915   CHOLHDL 2.9 05/10/2017 0915   CHOLHDL 2.6 05/10/2016 0905   VLDL 10 05/10/2016 0905   LDLCALC 92 05/10/2017 0915     Assessment & Plan:   1. Seizure disorder ([place]) Controlled with no recent seizure in the last 1-1/2 years - [id number] - carbamazepine (TEGRETOL) 200 MG tablet; Take 1 tablet (200 mg total) by mouth 2 (two) times daily.  Dispense: 60 tablet;  Refill: 6 - Carbamazepine level, total - CBC with Differential/Platelet  2. Stage 2 chronic kidney disease Last creatinine was 1.65 We will monitor Avoid NSAID use as much as possible.   Meds ordered this encounter  Medications  . carbamazepine (TEGRETOL) 200 MG tablet    Sig: Take 1 tablet (200 mg total) by mouth 2 (two) times daily.    Dispense:  60 tablet    Refill:  6    Follow-up: Return in about 6 months (around 07/25/2018) for Follow-up of chronic medical conditions.   Charlott Rakes MD

## 2018-01-23 ENCOUNTER — Telehealth: Payer: Self-pay

## 2018-01-23 LAB — CBC WITH DIFFERENTIAL/PLATELET
BASOS ABS: 0 10*3/uL (ref 0.0–0.2)
Basos: 0 %
EOS (ABSOLUTE): 0.3 10*3/uL (ref 0.0–0.4)
Eos: 6 %
HEMATOCRIT: 42.3 % (ref 37.5–51.0)
HEMOGLOBIN: 14.4 g/dL (ref 13.0–17.7)
Immature Grans (Abs): 0 10*3/uL (ref 0.0–0.1)
Immature Granulocytes: 0 %
LYMPHS ABS: 1.9 10*3/uL (ref 0.7–3.1)
Lymphs: 38 %
MCH: 30.1 pg (ref 26.6–33.0)
MCHC: 34 g/dL (ref 31.5–35.7)
MCV: 89 fL (ref 79–97)
MONOS ABS: 0.8 10*3/uL (ref 0.1–0.9)
Monocytes: 15 %
NEUTROS ABS: 2 10*3/uL (ref 1.4–7.0)
Neutrophils: 41 %
Platelets: 256 10*3/uL (ref 150–450)
RBC: 4.78 x10E6/uL (ref 4.14–5.80)
RDW: 14 % (ref 12.3–15.4)
WBC: 5 10*3/uL (ref 3.4–10.8)

## 2018-01-23 LAB — CMP14+EGFR
A/G RATIO: 1.4 (ref 1.2–2.2)
ALBUMIN: 4.4 g/dL (ref 3.5–5.5)
ALK PHOS: 84 IU/L (ref 39–117)
ALT: 12 IU/L (ref 0–44)
AST: 24 IU/L (ref 0–40)
BILIRUBIN TOTAL: 0.3 mg/dL (ref 0.0–1.2)
BUN / CREAT RATIO: 10 (ref 9–20)
BUN: 13 mg/dL (ref 6–24)
CO2: 21 mmol/L (ref 20–29)
Calcium: 9.5 mg/dL (ref 8.7–10.2)
Chloride: 104 mmol/L (ref 96–106)
Creatinine, Ser: 1.32 mg/dL — ABNORMAL HIGH (ref 0.76–1.27)
GFR calc Af Amer: 73 mL/min/{1.73_m2} (ref >59)
GFR calc non Af Amer: 63 mL/min/{1.73_m2} (ref >59)
GLOBULIN, TOTAL: 3.1 g/dL (ref 1.5–4.5)
Glucose: 82 mg/dL (ref 65–99)
POTASSIUM: 4.3 mmol/L (ref 3.5–5.2)
SODIUM: 141 mmol/L (ref 134–144)
Total Protein: 7.5 g/dL (ref 6.0–8.5)

## 2018-01-23 LAB — CARBAMAZEPINE LEVEL, TOTAL: CARBAMAZEPINE LVL: 6 ug/mL (ref 4.0–12.0)

## 2018-01-23 NOTE — Telephone Encounter (Signed)
-----   Message from Hoy RegisterEnobong Newlin, MD sent at 01/23/2018  8:22 AM EDT ----- Labs are normal.  Creatinine is down to 1.32

## 2018-01-23 NOTE — Telephone Encounter (Signed)
Patient was called and informed of lab results. 

## 2018-02-03 MED FILL — carBAMazepine 200 MG TABS: 200 | 30 days supply | Qty: 60 | Fill #0

## 2018-03-18 MED FILL — carBAMazepine 200 MG TABS: 200 | 30 days supply | Qty: 60 | Fill #1

## 2018-04-29 MED FILL — carBAMazepine 200 MG TABS: 200 | 30 days supply | Qty: 60 | Fill #2

## 2018-06-11 MED FILL — carBAMazepine 200 MG TABS: 200 | 30 days supply | Qty: 60 | Fill #3

## 2018-07-22 MED FILL — carBAMazepine 200 MG TABS: 200 | 30 days supply | Qty: 60 | Fill #4

## 2018-09-03 MED FILL — carBAMazepine 200 MG TABS: 200 | 30 days supply | Qty: 60 | Fill #5

## 2018-10-14 MED FILL — carBAMazepine 200 MG TABS: 200 | 30 days supply | Qty: 60 | Fill #6

## 2018-11-25 ENCOUNTER — Other Ambulatory Visit: Payer: Self-pay | Admitting: Family Medicine

## 2018-11-25 DIAGNOSIS — G40909 Epilepsy, unspecified, not intractable, without status epilepticus: Secondary | ICD-10-CM

## 2018-11-26 ENCOUNTER — Other Ambulatory Visit: Payer: Self-pay | Admitting: Family Medicine

## 2018-11-26 DIAGNOSIS — G40909 Epilepsy, unspecified, not intractable, without status epilepticus: Secondary | ICD-10-CM

## 2018-11-27 ENCOUNTER — Telehealth: Payer: Self-pay | Admitting: Family Medicine

## 2018-11-27 NOTE — Telephone Encounter (Signed)
1) Medication(s) Requested (by name): carbamazepine (TEGRETOL) 200 MG tablet [[id number]] ENDED (pt has appt on 12/08/18)   2) Pharmacy of Choice: [facility], [place]   3) Special Requests:   Approved medications will be sent to the pharmacy, we will reach out if there is an issue.  Requests made after 3pm may not be addressed until the following business day!  If a patient is unsure of the name of the medication(s) please note and ask patient to call back when they are able to provide all info, do not send to responsible party until all information is available!

## 2018-12-02 NOTE — Telephone Encounter (Signed)
Dr. Margarita Rana has declined the request for this medication several times, it will be addressed at his appt on 12/08/18

## 2018-12-03 ENCOUNTER — Other Ambulatory Visit: Payer: Self-pay | Admitting: Family Medicine

## 2018-12-03 DIAGNOSIS — G40909 Epilepsy, unspecified, not intractable, without status epilepticus: Secondary | ICD-10-CM

## 2018-12-08 ENCOUNTER — Encounter: Payer: Self-pay | Admitting: Family Medicine

## 2018-12-08 ENCOUNTER — Other Ambulatory Visit: Payer: Self-pay

## 2018-12-08 ENCOUNTER — Ambulatory Visit: Payer: Self-pay | Attending: Family Medicine | Admitting: Family Medicine

## 2018-12-08 VITALS — BP 122/76 | HR 75 | Temp 98.0°F | Ht 64.0 in | Wt 204.0 lb

## 2018-12-08 DIAGNOSIS — G40909 Epilepsy, unspecified, not intractable, without status epilepticus: Secondary | ICD-10-CM

## 2018-12-08 DIAGNOSIS — Z131 Encounter for screening for diabetes mellitus: Secondary | ICD-10-CM

## 2018-12-08 DIAGNOSIS — N528 Other male erectile dysfunction: Secondary | ICD-10-CM

## 2018-12-08 DIAGNOSIS — B370 Candidal stomatitis: Secondary | ICD-10-CM

## 2018-12-08 LAB — POCT GLYCOSYLATED HEMOGLOBIN (HGB A1C): HbA1c, POC (prediabetic range): 5.5 % — AB (ref 5.7–6.4)

## 2018-12-08 MED ORDER — CARBAMAZEPINE 200 MG PO TABS: 200.0000 mg | ORAL_TABLET | Freq: Two times a day (BID) | ORAL | 6 refills | Status: DC

## 2018-12-08 MED ORDER — NYSTATIN 100000 UNIT/ML MT SUSP: 5.0000 mL | Freq: Two times a day (BID) | OROMUCOSAL | 0 refills | Status: DC

## 2018-12-08 MED FILL — carBAMazepine 200 MG TABS: 200 | 30 days supply | Qty: 60 | Fill #0

## 2018-12-08 MED FILL — NYSTATIN 100,000 UNITS/ML S: 100000 | 6 days supply | Qty: 60 | Fill #0

## 2018-12-08 NOTE — Progress Notes (Signed)
Subjective:  Patient ID: Jason Flowers, male    DOB: 07-Mar-1971  Age: 48 y.o. 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  He has not had any recent seizures and has been compliant with carbamazepine.  He complains of oral thrush for the last 2 months on waking up every morning to the point that he has to scrape it off his tongue.  He is not a known diabetic and has not been on any antibiotics recently. He complains of erectile dysfunction for the last couple of months and endorses getting into the mood for sexual activity but his erection have been very infrequent.  Past Medical History:  Diagnosis Date  . Seizure disorder ([place])   . Seizures ([place])    since child     History reviewed. No pertinent surgical history.  Family History  Problem Relation Age of Onset  . Coronary artery disease Mother   . Cancer Father        pancreatic  . Cancer Sister   . Seizures Sister   . Seizures Son     No Known Allergies  Outpatient Medications Prior to Visit  Medication Sig Dispense Refill  . carbamazepine (TEGRETOL) 200 MG tablet Take 1 tablet (200 mg total) by mouth 2 (two) times daily. 60 tablet 6   No facility-administered medications prior to visit.      ROS Review of Systems  Constitutional: Negative for activity change and appetite change.  HENT: Negative for sinus pressure and sore throat.   Eyes: Negative for visual disturbance.  Respiratory: Negative for cough, chest tightness and shortness of breath.   Cardiovascular: Negative for chest pain and leg swelling.  Gastrointestinal: Negative for abdominal distention, abdominal pain, constipation and diarrhea.  Endocrine: Negative.   Genitourinary: Negative for dysuria.  Musculoskeletal: Negative for joint swelling and myalgias.  Skin: Negative for rash.   Allergic/Immunologic: Negative.   Neurological: Negative for weakness, light-headedness and numbness.  Psychiatric/Behavioral: Negative for dysphoric mood and suicidal ideas.    Objective:  BP 122/76   Pulse 75   Temp 98 F (36.7 C) (Oral)   Ht '5\' 4"'  (1.626 m)   Wt 204 lb (92.5 kg)   SpO2 99%   BMI 35.02 kg/m   BP/Weight 12/08/2018 01/22/2018 40/98/1191  Systolic BP 478 295 621  Diastolic BP 76 80 71  Wt. (Lbs) 204 195.2 202.8  BMI 35.02 33.51 34.81      Physical Exam Constitutional:      Appearance: He is well-developed.  Cardiovascular:     Rate and Rhythm: Normal rate.     Heart sounds: Normal heart sounds. No murmur.  Pulmonary:     Effort: Pulmonary effort is normal.     Breath sounds: Normal breath sounds. No wheezing or rales.  Chest:     Chest wall: No tenderness.  Abdominal:     General: Bowel sounds are normal. There is no distension.     Palpations: Abdomen is soft. There is no mass.     Tenderness: There is no abdominal tenderness.  Musculoskeletal: Normal range of motion.  Neurological:     Mental Status: He is alert and oriented to person, place, and time.     CMP Latest Ref Rng & Units 01/22/2018 05/10/2017 11/08/2016  Glucose 65 - 99 mg/dL 82 86 76  BUN 6 - 24 mg/dL  '13 15 14  ' Creatinine 0.76 - 1.27 mg/dL 1.32(H) 1.65(H) 1.33(H)  Sodium 134 - 144 mmol/L 141 139 143  Potassium 3.5 - 5.2 mmol/L 4.3 4.2 4.2  Chloride 96 - 106 mmol/L 104 102 106  CO2 20 - 29 mmol/L '21 24 22  ' Calcium 8.7 - 10.2 mg/dL 9.5 9.6 9.4  Total Protein 6.0 - 8.5 g/dL 7.5 7.2 7.1  Total Bilirubin 0.0 - 1.2 mg/dL 0.3 0.5 <0.2  Alkaline Phos 39 - 117 IU/L 84 84 89  AST 0 - 40 IU/L 24 33 34  ALT 0 - 44 IU/L '12 22 19    ' Lipid Panel     Component Value Date/Time   CHOL 160 05/10/2017 0915   TRIG 59 05/10/2017 0915   HDL 56 05/10/2017 0915   CHOLHDL 2.9 05/10/2017 0915   CHOLHDL 2.6 05/10/2016 0905   VLDL 10 05/10/2016 0905   LDLCALC 92 05/10/2017 0915    CBC     Component Value Date/Time   WBC 5.0 01/22/2018 1553   WBC 5.3 08/06/2016 1201   RBC 4.78 01/22/2018 1553   RBC 4.88 08/06/2016 1201   HGB 14.4 01/22/2018 1553   HCT 42.3 01/22/2018 1553   PLT 256 01/22/2018 1553   MCV 89 01/22/2018 1553   MCH 30.1 01/22/2018 1553   MCH 30.3 08/06/2016 1201   MCHC 34.0 01/22/2018 1553   MCHC 34.0 08/06/2016 1201   RDW 14.0 01/22/2018 1553   LYMPHSABS 1.9 01/22/2018 1553   MONOABS 795 08/06/2016 1201   EOSABS 0.3 01/22/2018 1553   BASOSABS 0.0 01/22/2018 1553    Lab Results  Component Value Date   HGBA1C 5.5 (A) 12/08/2018     Assessment & Plan:   1. Seizure disorder ([place]) No recent seizures - CMP14+EGFR - carbamazepine (TEGRETOL) 200 MG tablet; Take 1 tablet (200 mg total) by mouth 2 (two) times daily.  Dispense: 60 tablet; Refill: 6 - Carbamazepine level, total  2. Oral thrush Screening for diabetes is negative - HIV Antibody (routine testing w rflx) - nystatin (MYCOSTATIN) 100000 UNIT/ML suspension; Take 5 mLs (500,000 Units total) by mouth 2 (two) times a day.  Dispense: 60 mL; Refill: 0  3. Other male erectile dysfunction He would like to hold off on Viagra while we check his testosterone levels Discussed risks and benefit of testosterone replacement therapy and he would like to proceed with this if deficient. - Testosterone, Free, Total, SHBG   Health Care Maintenance: Tdap at next visit Meds ordered this encounter  Medications  . carbamazepine (TEGRETOL) 200 MG tablet    Sig: Take 1 tablet (200 mg total) by mouth 2 (two) times daily.    Dispense:  60 tablet    Refill:  6  . nystatin (MYCOSTATIN) 100000 UNIT/ML suspension    Sig: Take 5 mLs (500,000 Units total) by mouth 2 (two) times a day.    Dispense:  60 mL    Refill:  0    Follow-up: Return in about 6 months (around 06/10/2019) for medical conditions.       Charlott Rakes, MD, FAAFP. [facility] and [place], [place]   12/08/2018, 3:58 PM

## 2018-12-10 LAB — CMP14+EGFR
ALT: 34 IU/L (ref 0–44)
AST: 36 IU/L (ref 0–40)
Albumin/Globulin Ratio: 1.5 (ref 1.2–2.2)
Albumin: 4.4 g/dL (ref 4.0–5.0)
Alkaline Phosphatase: 89 IU/L (ref 39–117)
BUN/Creatinine Ratio: 9 (ref 9–20)
BUN: 13 mg/dL (ref 6–24)
Bilirubin Total: <0.2 mg/dL (ref 0.0–1.2)
CO2: 20 mmol/L (ref 20–29)
Calcium: 9.5 mg/dL (ref 8.7–10.2)
Chloride: 104 mmol/L (ref 96–106)
Creatinine, Ser: 1.51 mg/dL — ABNORMAL HIGH (ref 0.76–1.27)
GFR calc Af Amer: 62 mL/min/{1.73_m2} (ref >59)
GFR calc non Af Amer: 53 mL/min/{1.73_m2} — ABNORMAL LOW (ref >59)
Globulin, Total: 2.9 g/dL (ref 1.5–4.5)
Glucose: 88 mg/dL (ref 65–99)
Potassium: 4.1 mmol/L (ref 3.5–5.2)
Sodium: 142 mmol/L (ref 134–144)
Total Protein: 7.3 g/dL (ref 6.0–8.5)

## 2018-12-10 LAB — TESTOSTERONE, FREE, TOTAL, SHBG
Sex Hormone Binding: 101.6 nmol/L — ABNORMAL HIGH (ref 16.5–55.9)
Testosterone, Free: 5.3 pg/mL — ABNORMAL LOW (ref 6.8–21.5)
Testosterone: 673 ng/dL (ref 264–916)

## 2018-12-10 LAB — HIV ANTIBODY (ROUTINE TESTING W REFLEX): HIV Screen 4th Generation wRfx: NONREACTIVE

## 2018-12-10 LAB — CARBAMAZEPINE LEVEL, TOTAL: Carbamazepine (Tegretol), S: 2.7 ug/mL — ABNORMAL LOW (ref 4.0–12.0)

## 2018-12-12 ENCOUNTER — Telehealth: Payer: Self-pay

## 2018-12-12 NOTE — Telephone Encounter (Signed)
-----   Message from Charlott Rakes, MD sent at 12/11/2018  8:21 AM EDT ----- Testosterone is normal.  Carbamazepine level is low, please ensure compliance to prevent seizures.  Kidney function is slightly abnormal but this is likely due to his increased muscle mass.

## 2018-12-12 NOTE — Telephone Encounter (Signed)
Patient was called and informed of his lab results via voicemail.

## 2019-01-16 MED FILL — carBAMazepine 200 MG TABS: 200 | 30 days supply | Qty: 60 | Fill #1

## 2019-02-25 MED FILL — carBAMazepine 200 MG TABS: 200 | 30 days supply | Qty: 60 | Fill #2

## 2019-03-31 MED FILL — carBAMazepine 200 MG TABS: 200 | 30 days supply | Qty: 60 | Fill #3

## 2019-05-11 MED FILL — carBAMazepine 200 MG TABS: 200 | 30 days supply | Qty: 60 | Fill #4

## 2019-06-16 MED FILL — carBAMazepine 200 MG TABS: 200 | 30 days supply | Qty: 60 | Fill #5

## 2019-07-24 MED FILL — carBAMazepine 200 MG TABS: 200 | 30 days supply | Qty: 60 | Fill #6

## 2019-09-16 ENCOUNTER — Other Ambulatory Visit: Payer: Self-pay

## 2019-09-16 ENCOUNTER — Other Ambulatory Visit: Payer: Self-pay | Admitting: Family Medicine

## 2019-09-16 ENCOUNTER — Encounter: Payer: Self-pay | Admitting: Family Medicine

## 2019-09-16 ENCOUNTER — Ambulatory Visit: Payer: Self-pay | Attending: Family Medicine | Admitting: Family Medicine

## 2019-09-16 VITALS — BP 113/71 | HR 69 | Ht 64.0 in | Wt 203.0 lb

## 2019-09-16 DIAGNOSIS — Z13228 Encounter for screening for other metabolic disorders: Secondary | ICD-10-CM

## 2019-09-16 DIAGNOSIS — N182 Chronic kidney disease, stage 2 (mild): Secondary | ICD-10-CM

## 2019-09-16 DIAGNOSIS — G40909 Epilepsy, unspecified, not intractable, without status epilepticus: Secondary | ICD-10-CM

## 2019-09-16 MED ORDER — CARBAMAZEPINE 200 MG PO TABS: 200.0000 mg | ORAL_TABLET | Freq: Two times a day (BID) | ORAL | 6 refills | Status: DC

## 2019-09-16 MED FILL — carBAMazepine 200 MG TABS: 200 | 30 days supply | Qty: 60 | Fill #0

## 2019-09-16 NOTE — Progress Notes (Signed)
Subjective:  Patient ID: Jason Flowers, male    DOB: [birth year]  Age: 49 y.o. 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  He has not had any recent seizures and has been compliant with carbamazepine.  He has an active lifestyle and goes to the gym regularly; also eats a healthy diet.  He has no additional concerns at this time. Denies presence of chest pain, dyspnea.  Past Medical History:  Diagnosis Date  . Seizure disorder (Jason Flowers)   . Seizures (Jason Flowers)    since child     No past surgical history on file.  Family History  Problem Relation Age of Onset  . Coronary artery disease Mother   . Cancer Father        pancreatic  . Cancer Sister   . Seizures Sister   . Seizures Son     No Known Allergies  Outpatient Medications Prior to Visit  Medication Sig Dispense Refill  . carbamazepine (TEGRETOL) 200 MG tablet Take 1 tablet (200 mg total) by mouth 2 (two) times daily. 60 tablet 6  . nystatin (MYCOSTATIN) 100000 UNIT/ML suspension Take 5 mLs (500,000 Units total) by mouth 2 (two) times a day. (Patient not taking: Reported on 09/16/2019) 60 mL 0   No facility-administered medications prior to visit.     ROS Review of Systems  Constitutional: Negative for activity change and appetite change.  HENT: Negative for sinus pressure and sore throat.   Eyes: Negative for visual disturbance.  Respiratory: Negative for cough, chest tightness and shortness of breath.   Cardiovascular: Negative for chest pain and leg swelling.  Gastrointestinal: Negative for abdominal distention, abdominal pain, constipation and diarrhea.  Endocrine: Negative.   Genitourinary: Negative for dysuria.  Musculoskeletal: Negative for joint swelling and myalgias.  Skin: Negative for rash.  Allergic/Immunologic: Negative.   Neurological:  Negative for weakness, light-headedness and numbness.  Psychiatric/Behavioral: Negative for dysphoric mood and suicidal ideas.    Objective:  BP 113/71   Pulse 69   Ht _0  (1.626 m)   Wt 203 lb (92.1 kg)   SpO2 97%   BMI 34.84 kg/m   BP/Weight 09/16/2019 12/08/2018 6/60/6301  Systolic BP 601 093 235  Diastolic BP 71 76 80  Wt. (Lbs) 203 204 195.2  BMI 34.84 35.02 33.51      Physical Exam Constitutional:      Appearance: He is well-developed.  Neck:     Vascular: No JVD.  Cardiovascular:     Rate and Rhythm: Normal rate.     Heart sounds: Normal heart sounds. No murmur.  Pulmonary:     Effort: Pulmonary effort is normal.     Breath sounds: Normal breath sounds. No wheezing or rales.  Chest:     Chest wall: No tenderness.  Abdominal:     General: Bowel sounds are normal. There is no distension.     Palpations: Abdomen is soft. There is no mass.     Tenderness: There is no abdominal tenderness.  Musculoskeletal:        General: Normal range of motion.     Right lower leg: No edema.     Left lower leg: No edema.  Neurological:     Mental Status: He is alert and oriented to person, place, and time.  Psychiatric:  Mood and Affect: Mood normal.     CMP Latest Ref Rng & Units 12/08/2018 01/22/2018 05/10/2017  Glucose 65 - 99 mg/dL 88 82 86  BUN 6 - 24 mg/dL _0 Creatinine 0.76 - 1.27 mg/dL 1.51(H) 1.32(H) 1.65(H)  Sodium 134 - 144 mmol/L 142 141 139  Potassium 3.5 - 5.2 mmol/L 4.1 4.3 4.2  Chloride 96 - 106 mmol/L 104 104 102  CO2 20 - 29 mmol/L _1 Calcium 8.7 - 10.2 mg/dL 9.5 9.5 9.6  Total Protein 6.0 - 8.5 g/dL 7.3 7.5 7.2  Total Bilirubin 0.0 - 1.2 mg/dL <0.2 0.3 0.5  Alkaline Phos 39 - 117 IU/L 89 84 84  AST 0 - 40 IU/L 36 24 33  ALT 0 - 44 IU/L 34 12 22    Lipid Panel     Component Value Date/Time   CHOL 160 05/10/2017 0915   TRIG 59 05/10/2017 0915   HDL 56 05/10/2017 0915   CHOLHDL 2.9 05/10/2017 0915   CHOLHDL 2.6 05/10/2016  0905   VLDL 10 05/10/2016 0905   LDLCALC 92 05/10/2017 0915    CBC    Component Value Date/Time   WBC 5.0 01/22/2018 1553   WBC 5.3 08/06/2016 1201   RBC 4.78 01/22/2018 1553   RBC 4.88 08/06/2016 1201   HGB 14.4 01/22/2018 1553   HCT 42.3 01/22/2018 1553   PLT 256 01/22/2018 1553   MCV 89 01/22/2018 1553   MCH 30.1 01/22/2018 1553   MCH 30.3 08/06/2016 1201   MCHC 34.0 01/22/2018 1553   MCHC 34.0 08/06/2016 1201   RDW 14.0 01/22/2018 1553   LYMPHSABS 1.9 01/22/2018 1553   MONOABS 795 08/06/2016 1201   EOSABS 0.3 01/22/2018 1553   BASOSABS 0.0 01/22/2018 1553    Lab Results  Component Value Date   HGBA1C 5.5 (A) 12/08/2018    Assessment & Plan:   1. Seizure disorder ([place]) Controlled - CMP14+EGFR; Future - Carbamazepine level, total; Future - carbamazepine (TEGRETOL) 200 MG tablet; Take 1 tablet (200 mg total) by mouth 2 (two) times daily.  Dispense: 60 tablet; Refill: 6 - Lipid panel  2. Stage 2 chronic kidney disease Stable Avoid NSAIDS  3. Screening for metabolic disorder Will screen for hyperlipidemia   Return in about 6 months (around 03/17/2020) for chronic disease management.   Charlott Rakes, MD, FAAFP. [facility] and [place], [place]   09/16/2019, 4:05 PM

## 2019-09-16 NOTE — Addendum Note (Signed)
Addended byHoy Register on: 09/16/2019 04:22 PM   Modules accepted: Orders

## 2019-09-22 ENCOUNTER — Other Ambulatory Visit: Payer: Self-pay

## 2019-09-22 ENCOUNTER — Ambulatory Visit: Payer: Self-pay | Attending: Family Medicine

## 2019-09-22 DIAGNOSIS — Z13228 Encounter for screening for other metabolic disorders: Secondary | ICD-10-CM

## 2019-09-22 DIAGNOSIS — G40909 Epilepsy, unspecified, not intractable, without status epilepticus: Secondary | ICD-10-CM

## 2019-09-23 LAB — CMP14+EGFR
ALT: 34 IU/L (ref 0–44)
AST: 38 IU/L (ref 0–40)
Albumin/Globulin Ratio: 1.4 (ref 1.2–2.2)
Albumin: 4.3 g/dL (ref 4.0–5.0)
Alkaline Phosphatase: 85 IU/L (ref 39–117)
BUN/Creatinine Ratio: 11 (ref 9–20)
BUN: 18 mg/dL (ref 6–24)
Bilirubin Total: 0.6 mg/dL (ref 0.0–1.2)
CO2: 26 mmol/L (ref 20–29)
Calcium: 9.4 mg/dL (ref 8.7–10.2)
Chloride: 104 mmol/L (ref 96–106)
Creatinine, Ser: 1.58 mg/dL — ABNORMAL HIGH (ref 0.76–1.27)
GFR calc Af Amer: 59 mL/min/{1.73_m2} — ABNORMAL LOW (ref >59)
GFR calc non Af Amer: 51 mL/min/{1.73_m2} — ABNORMAL LOW (ref >59)
Globulin, Total: 3 g/dL (ref 1.5–4.5)
Glucose: 92 mg/dL (ref 65–99)
Potassium: 4.2 mmol/L (ref 3.5–5.2)
Sodium: 141 mmol/L (ref 134–144)
Total Protein: 7.3 g/dL (ref 6.0–8.5)

## 2019-09-23 LAB — LIPID PANEL
Chol/HDL Ratio: 2.7 ratio (ref 0.0–5.0)
Cholesterol, Total: 162 mg/dL (ref 100–199)
HDL: 59 mg/dL (ref >39)
LDL Chol Calc (NIH): 93 mg/dL (ref 0–99)
Triglycerides: 48 mg/dL (ref 0–149)
VLDL Cholesterol Cal: 10 mg/dL (ref 5–40)

## 2019-09-23 LAB — CARBAMAZEPINE LEVEL, TOTAL: Carbamazepine (Tegretol), S: 5.4 ug/mL (ref 4.0–12.0)

## 2019-09-25 ENCOUNTER — Telehealth: Payer: Self-pay

## 2019-09-25 NOTE — Telephone Encounter (Signed)
Patient was called and informed of lab results. 

## 2019-09-25 NOTE — Telephone Encounter (Signed)
-----   Message from Hoy Register, MD sent at 09/23/2019  9:47 AM EDT ----- Please inform the patient that labs are normal. Thank you.

## 2019-10-29 MED FILL — carBAMazepine 200 MG TABS: 200 | 30 days supply | Qty: 60 | Fill #1

## 2019-12-02 MED FILL — carBAMazepine 200 MG TABS: 200 | 30 days supply | Qty: 60 | Fill #2

## 2020-01-07 MED FILL — carBAMazepine 200 MG TABS: 200 | 30 days supply | Qty: 60 | Fill #3

## 2020-02-16 MED FILL — CarBAMazepine 200 MG TAB: 200 | 30 days supply | Qty: 60 | Fill #4

## 2020-03-17 MED FILL — CarBAMazepine 200 MG TAB: 200 | 30 days supply | Qty: 60 | Fill #5

## 2020-04-25 MED FILL — CarBAMazepine 200 MG TAB: 200 | 30 days supply | Qty: 60 | Fill #6

## 2020-05-31 ENCOUNTER — Telehealth: Payer: Self-pay | Admitting: Physician Assistant

## 2020-06-01 ENCOUNTER — Other Ambulatory Visit: Payer: Self-pay | Admitting: Family Medicine

## 2020-06-01 DIAGNOSIS — G40909 Epilepsy, unspecified, not intractable, without status epilepticus: Secondary | ICD-10-CM

## 2020-06-01 NOTE — Telephone Encounter (Signed)
Requested medication (s) are due for refill today: yes  Requested medication (s) are on the active medication list: yes  Last refill:  04/25/2020  Future visit scheduled: yes  Notes to clinic: this refill cannot be delegated   Requested Prescriptions  Pending Prescriptions Disp Refills   carbamazepine (TEGRETOL) 200 MG tablet [Pharmacy Med Name: CarBAMazepine 200 MG TAB 200 Tablet] 60 tablet 6    Sig: Take 1 tablet (200 mg total) by mouth 2 (two) times daily.      Not Delegated - Neurology:  Anticonvulsants - carbamazepine Failed - 06/01/2020 11:30 AM      Failed - This refill cannot be delegated      Failed - AST in normal range and within 90 days    AST  Date Value Ref Range Status  09/22/2019 38 0 - 40 IU/L Final          Failed - ALT in normal range and within 90 days    ALT  Date Value Ref Range Status  09/22/2019 34 0 - 44 IU/L Final          Failed - WBC in normal range and within 90 days    WBC  Date Value Ref Range Status  01/22/2018 5.0 3.4 - 10.8 x10E3/uL Final  08/06/2016 5.3 3.8 - 10.8 K/uL Final          Failed - PLT in normal range and within 90 days    Platelets  Date Value Ref Range Status  01/22/2018 256 150 - 450 x10E3/uL Final          Failed - HGB in normal range and within 90 days    Hemoglobin  Date Value Ref Range Status  01/22/2018 14.4 13.0 - 17.7 g/dL Final          Failed - Na in normal range and within 90 days    Sodium  Date Value Ref Range Status  09/22/2019 141 134 - 144 mmol/L Final          Failed - HCT in normal range and within 90 days    Hematocrit  Date Value Ref Range Status  01/22/2018 42.3 37.5 - 51.0 % Final          Passed - Carbamazepine (serum) in normal range and within 360 days    Carbamazepine, Total  Date Value Ref Range Status  05/10/2017 5.8 4.0 - 12.0 ug/mL Final    Comment:             In conjunction with other antiepileptic drugs                                Therapeutic  4.0 -  8.0                                 Toxicity     9.0 - 12.0                                    Carbamazepine alone                                Therapeutic  8.0 - 12.0  Detection Limit =  0.5                           <0.5 indicated None Detected    Carbamazepine (Tegretol), S  Date Value Ref Range Status  09/22/2019 5.4 4.0 - 12.0 ug/mL Final    Comment:             In conjunction with other antiepileptic drugs                                Therapeutic  4.0 -  8.0                                Toxicity     9.0 - 12.0                                    Carbamazepine alone                                Therapeutic  8.0 - 12.0                                 Detection Limit =  2.0                           <2.0 indicated None Detected           Passed - Valid encounter within last 12 months    Recent Outpatient Visits           8 months ago Stage 2 chronic kidney disease   [facility] Hoy Register, MD   1 year ago Oral thrush   [facility] Rister, Odette Horns, MD   2 years ago Stage 2 chronic kidney disease   [facility] Hoy Register, MD   3 years ago Need for influenza vaccination   [facility] Hoy Register, MD   3 years ago Stage 3 chronic kidney disease   [facility] [facility] Hoy Register, MD       Future Appointments             In 3 weeks [place], Marzella Schlein, PA-C [facility]

## 2020-06-01 NOTE — Telephone Encounter (Signed)
Patient is calling again to check the status of his refill request.  Please advise

## 2020-06-03 ENCOUNTER — Other Ambulatory Visit: Payer: Self-pay | Admitting: Family Medicine

## 2020-06-03 MED FILL — CarBAMazepine 200 MG TAB: 200 | 30 days supply | Qty: 60 | Fill #0

## 2020-06-23 ENCOUNTER — Ambulatory Visit: Payer: Self-pay | Attending: Physician Assistant | Admitting: Physician Assistant

## 2020-06-23 ENCOUNTER — Other Ambulatory Visit: Payer: Self-pay | Admitting: Physician Assistant

## 2020-06-23 ENCOUNTER — Other Ambulatory Visit: Payer: Self-pay

## 2020-06-23 ENCOUNTER — Encounter: Payer: Self-pay | Admitting: Physician Assistant

## 2020-06-23 VITALS — BP 119/69 | HR 76 | Temp 98.3°F | Ht 64.0 in | Wt 207.0 lb

## 2020-06-23 DIAGNOSIS — Z79899 Other long term (current) drug therapy: Secondary | ICD-10-CM

## 2020-06-23 DIAGNOSIS — G40909 Epilepsy, unspecified, not intractable, without status epilepticus: Secondary | ICD-10-CM

## 2020-06-23 DIAGNOSIS — N182 Chronic kidney disease, stage 2 (mild): Secondary | ICD-10-CM

## 2020-06-23 DIAGNOSIS — Z8042 Family history of malignant neoplasm of prostate: Secondary | ICD-10-CM

## 2020-06-23 DIAGNOSIS — N528 Other male erectile dysfunction: Secondary | ICD-10-CM

## 2020-06-23 MED ORDER — CARBAMAZEPINE 200 MG PO TABS: 200.0000 mg | ORAL_TABLET | Freq: Two times a day (BID) | ORAL | 5 refills | Status: DC

## 2020-06-23 MED FILL — CarBAMazepine 200 MG TAB: 200 | 30 days supply | Qty: 60 | Fill #0

## 2020-06-23 NOTE — Progress Notes (Signed)
Jason Flowers, is a 50 y.o. male  [id number]:[id number]  [id number]  DOB - 08-01-1970  Subjective:  Chief Complaint and HPI: Jason Flowers is a 50 y.o. male here today for a check up and concerns.  +seizure history but stable on tegretol 200mg  bid for 2 years without seizure.  He is going to apply for orange card so he can get other screening done such as colonoscopy.  He has never had colonoscopy.  He also needs to see a neurologist as he has not seen one in years.    He would like to get his testosterone levels checked.  His dad died at a young age of prostate cancer and needs to get PSA level checked.  He works out and tries to eat healthy.    ROS:   Constitutional:  No f/c, No night sweats, No unexplained weight loss. EENT:  No vision changes, No blurry vision, No hearing changes. No mouth, throat, or ear problems.  Respiratory: No cough, No SOB Cardiac: No CP, no palpitations GI:  No abd pain, No N/V/D. GU: No Urinary s/sx Musculoskeletal: No joint pain Neuro: No headache, no dizziness, no motor weakness.  Skin: No rash Endocrine:  No polydipsia. No polyuria.  Psych: Denies SI/HI  No problems updated.  ALLERGIES: Allergies  Allergen Reactions  . Shrimp Flavor [Flavoring Agent]     swelling    PAST MEDICAL HISTORY: Past Medical History:  Diagnosis Date  . Seizure disorder ([facility])   . Seizures ([facility])    since child     MEDICATIONS AT HOME: Prior to Admission medications   Medication Sig Start Date End Date Taking? Authorizing Provider  carbamazepine (TEGRETOL) 200 MG tablet Take 1 tablet (200 mg total) by mouth 2 (two) times daily. 06/23/20 07/23/20  07/25/20, PA-C  nystatin (MYCOSTATIN) 100000 UNIT/ML suspension Take 5 mLs (500,000 Units total) by mouth 2 (two) times a day. Patient not taking: No sig reported 12/08/18   12/10/18, MD     Objective:  EXAM:   Vitals:   06/23/20 0844  BP: 119/69  Pulse: 76  Temp: 98.3 F (36.8 C)  TempSrc:  Oral  SpO2: 97%  Weight: 207 lb (93.9 kg)  Height: 5\' 4"  (1.626 m)    General appearance : A&OX3. NAD. Non-toxic-appearing HEENT: Atraumatic and Normocephalic.  PERRLA. EOM intact.  TM clear B. Mouth-MMM, post pharynx WNL w/o erythema, No PND. Neck: supple, no JVD. No cervical lymphadenopathy. No thyromegaly Chest/Lungs:  Breathing-non-labored, Good air entry bilaterally, breath sounds normal without rales, rhonchi, or wheezing  CVS: S1 S2 regular, no murmurs, gallops, rubs  Abdomen: Bowel sounds present, Non tender and not distended with no gaurding, rigidity or rebound. Extremities: Bilateral Lower Ext shows no edema, both legs are warm to touch with = pulse throughout Neurology:  CN II-XII grossly intact, Non focal.   Psych:  TP linear. J/I WNL. Normal speech. Appropriate eye contact and affect.  Skin:  No Rash  Data Review Lab Results  Component Value Date   HGBA1C 5.5 (A) 12/08/2018     Assessment & Plan   1. Stage 2 chronic kidney disease - Comprehensive metabolic panel  2. Seizure disorder ([facility]) No seizure activity in 2 years-keep current dosing - Carbamazepine, Free and Total - Comprehensive metabolic panel - carbamazepine (TEGRETOL) 200 MG tablet; Take 1 tablet (200 mg total) by mouth 2 (two) times daily.  Dispense: 60 tablet; Refill: 5 -will refer if he gets OC or other insurance  3. Other  male erectile dysfunction - Testosterone,Free and Total  4. Family history of prostate cancer in father - PSA  5. High risk medication use - Carbamazepine, Free and Total  Patient have been counseled extensively about nutrition and exercise  Return in about 6 months (around 12/21/2020) for Dr Alvis Lemmings; check up.  The patient was given clear instructions to go to ER or return to medical center if symptoms don't improve, worsen or new problems develop. The patient verbalized understanding. The patient was told to call to get lab results if they haven't heard anything in the  next week.     Georgian Co, PA-C [facility] and [facility]ness [place], [place] [phone]   06/23/2020, 9:14 AMPatient ID: Jason Flowers, male   DOB: [birth year], 50 y.o.   MRN: [id number]

## 2020-07-04 ENCOUNTER — Other Ambulatory Visit: Payer: Self-pay | Admitting: *Deleted

## 2020-07-06 MED FILL — CarBAMazepine 200 MG TAB: 200 | 30 days supply | Qty: 60 | Fill #0

## 2020-07-11 LAB — COMPREHENSIVE METABOLIC PANEL WITH GFR
ALT: 29 IU/L (ref 0–44)
AST: 32 IU/L (ref 0–40)
Albumin/Globulin Ratio: 1.4 (ref 1.2–2.2)
Albumin: 4.2 g/dL (ref 4.0–5.0)
Alkaline Phosphatase: 51 IU/L (ref 44–121)
BUN/Creatinine Ratio: 10 (ref 9–20)
BUN: 15 mg/dL (ref 6–24)
Bilirubin Total: 0.3 mg/dL (ref 0.0–1.2)
CO2: 23 mmol/L (ref 20–29)
Calcium: 9.4 mg/dL (ref 8.7–10.2)
Chloride: 105 mmol/L (ref 96–106)
Creatinine, Ser: 1.43 mg/dL — ABNORMAL HIGH (ref 0.76–1.27)
GFR calc Af Amer: 66 mL/min/1.73
GFR calc non Af Amer: 57 mL/min/1.73 — ABNORMAL LOW
Globulin, Total: 2.9 g/dL (ref 1.5–4.5)
Glucose: 80 mg/dL (ref 65–99)
Potassium: 4.3 mmol/L (ref 3.5–5.2)
Sodium: 141 mmol/L (ref 134–144)
Total Protein: 7.1 g/dL (ref 6.0–8.5)

## 2020-07-11 LAB — TESTOSTERONE,FREE AND TOTAL
Testosterone, Free: 11.5 pg/mL (ref 7.2–24.0)
Testosterone: 338 ng/dL (ref 264–916)

## 2020-07-11 LAB — CARBAMAZEPINE, FREE AND TOTAL: Carbamazepine, Total: 7.3 ug/mL (ref 4.0–12.0)

## 2020-07-11 LAB — PSA: Prostate Specific Ag, Serum: 0.5 ng/mL (ref 0.0–4.0)

## 2020-09-19 ENCOUNTER — Other Ambulatory Visit: Payer: Self-pay

## 2020-09-19 MED FILL — Carbamazepine Tab 200 MG: ORAL | 30 days supply | Qty: 60 | Fill #0 | Status: AC

## 2020-09-21 ENCOUNTER — Other Ambulatory Visit: Payer: Self-pay

## 2020-10-10 ENCOUNTER — Other Ambulatory Visit: Payer: Self-pay

## 2020-10-10 MED FILL — Carbamazepine Tab 200 MG: ORAL | 30 days supply | Qty: 60 | Fill #1 | Status: CN

## 2020-10-11 ENCOUNTER — Other Ambulatory Visit: Payer: Self-pay

## 2020-10-28 ENCOUNTER — Other Ambulatory Visit: Payer: Self-pay

## 2020-10-28 MED FILL — Carbamazepine Tab 200 MG: ORAL | 30 days supply | Qty: 60 | Fill #1 | Status: AC

## 2020-10-31 ENCOUNTER — Other Ambulatory Visit: Payer: Self-pay

## 2020-12-06 ENCOUNTER — Other Ambulatory Visit: Payer: Self-pay

## 2020-12-06 MED FILL — Carbamazepine Tab 200 MG: ORAL | 30 days supply | Qty: 60 | Fill #2 | Status: AC

## 2020-12-07 ENCOUNTER — Encounter (INDEPENDENT_AMBULATORY_CARE_PROVIDER_SITE_OTHER): Payer: Self-pay

## 2020-12-07 ENCOUNTER — Other Ambulatory Visit: Payer: Self-pay

## 2020-12-07 ENCOUNTER — Encounter: Payer: Self-pay | Admitting: Physician Assistant

## 2020-12-07 ENCOUNTER — Ambulatory Visit: Payer: Self-pay | Attending: Physician Assistant | Admitting: Physician Assistant

## 2020-12-07 VITALS — BP 147/86 | HR 93 | Resp 16 | Ht 66.0 in | Wt 221.2 lb

## 2020-12-07 DIAGNOSIS — R030 Elevated blood-pressure reading, without diagnosis of hypertension: Secondary | ICD-10-CM

## 2020-12-07 DIAGNOSIS — R059 Cough, unspecified: Secondary | ICD-10-CM

## 2020-12-07 DIAGNOSIS — K219 Gastro-esophageal reflux disease without esophagitis: Secondary | ICD-10-CM

## 2020-12-07 DIAGNOSIS — N182 Chronic kidney disease, stage 2 (mild): Secondary | ICD-10-CM

## 2020-12-07 MED ORDER — AZITHROMYCIN 250 MG PO TABS
ORAL_TABLET | ORAL | 0 refills | Status: AC
  Filled 2020-12-07: qty 6, 5d supply, fill #0

## 2020-12-07 MED ORDER — OMEPRAZOLE 20 MG PO CPDR
20.0000 mg | DELAYED_RELEASE_CAPSULE | Freq: Every day | ORAL | 3 refills | Status: DC
  Filled 2020-12-07: qty 30, 30d supply, fill #0

## 2020-12-07 NOTE — Progress Notes (Signed)
Patient ID: Jason Flowers, male   DOB: 1970/06/21, 50 y.o.   MRN: [id number]   Jason Flowers, is a 50 y.o. male  [id number]  [id number]  DOB - 09/04/1970  Subjective:  Chief Complaint and HPI: Jason Flowers is a 50 y.o. male here today with 4 month h/o cough that is worse at night.  He has gained weight in the last 6 months(intentional).  He is a Pharmacist, community.  No cigs, alcohol or illicits.  No N/D.  No melena/hematochezia.  No weight loss.  No dysphagia.  Sometimes mucus is yellowish/whitish.    ROS:   Constitutional:  No f/c, No night sweats, No unexplained weight loss. EENT:  No vision changes, No blurry vision, No hearing changes. No mouth, throat, or ear problems.  Respiratory: +cough, No SOB Cardiac: No CP, no palpitations GI:  No abd pain, No N/V/D. GU: No Urinary s/sx Musculoskeletal: No joint pain Neuro: No headache, no dizziness, no motor weakness.  Skin: No rash Endocrine:  No polydipsia. No polyuria.  Psych: Denies SI/HI  No problems updated.  ALLERGIES: Allergies  Allergen Reactions   Shrimp Flavor [Flavoring Agent]     swelling    PAST MEDICAL HISTORY: Past Medical History:  Diagnosis Date   Seizure disorder ([facility])    Seizures ([facility])    since child     MEDICATIONS AT HOME: Prior to Admission medications   Medication Sig Start Date End Date Taking? Authorizing Provider  azithromycin (ZITHROMAX) 250 MG tablet Take 2 tablets by mouth on day 1, then 1 tablet daily on days 2 through 5 12/07/20 12/12/20 Yes Mabry Santarelli, Marzella Schlein, PA-C  omeprazole (PRILOSEC) 20 MG capsule Take 1 capsule (20 mg total) by mouth daily. 12/07/20  Yes [name] M, PA-C  carbamazepine (TEGRETOL) 200 MG tablet TAKE 1 TABLET (200 MG TOTAL) BY MOUTH 2 (TWO) TIMES DAILY. 06/23/20 06/23/21  Anders Simmonds, PA-C  nystatin (MYCOSTATIN) 100000 UNIT/ML suspension Take 5 mLs (500,000 Units total) by mouth 2 (two) times a day. Patient not taking: No sig reported 12/08/18   Hoy Register, MD     Objective:  EXAM:   Vitals:   12/07/20 1341  BP: (!) 147/86  Pulse: 93  Resp: 16  SpO2: 95%  Weight: 221 lb 3.2 oz (100.3 kg)  Height: 5\' 6"  (1.676 m)    General appearance : A&OX3. NAD. Non-toxic-appearing HEENT: Atraumatic and Normocephalic.  PERRLA. EOM intact.  TM clear B. Mouth-MMM, post pharynx WNL w/o erythema, No PND. Neck: supple, no JVD. No cervical lymphadenopathy. No thyromegaly Chest/Lungs:  Breathing-non-labored, Good air entry bilaterally, breath sounds normal without rales, rhonchi, or wheezing  CVS: S1 S2 regular, no murmurs, gallops, rubs  Extremities: Bilateral Lower Ext shows no edema, both legs are warm to touch with = pulse throughout Neurology:  CN II-XII grossly intact, Non focal.   Psych:  TP linear. J/I WNL. Normal speech. Appropriate eye contact and affect.  Skin:  No Rash  Data Review Lab Results  Component Value Date   HGBA1C 5.5 (A) 12/08/2018     Assessment & Plan   1. Cough Will cover for atypicals - azithromycin (ZITHROMAX) 250 MG tablet; Take 2 tablets by mouth on day 1, then 1 tablet daily on days 2 through 5  Dispense: 6 tablet; Refill: 0 - Comprehensive metabolic panel  2. Gastroesophageal reflux disease without esophagitis More likely acid reflux given recent weight gain - H. pylori breath test - Comprehensive metabolic panel - CBC with Differential/Platelet - omeprazole (  PRILOSEC) 20 MG capsule; Take 1 capsule (20 mg total) by mouth daily.  Dispense: 30 capsule; Refill: 3  3. Stage 2 chronic kidney disease - Comprehensive metabolic panel  4. Elevated blood pressure reading We have discussed target BP range and blood pressure goal. I have advised patient to check BP regularly and to call us back or report to clinic if the numbers are consistently higher than 140/90. We discussed the importance of compliance with medical therapy and DASH diet recommended, consequences of uncontrolled hypertension discussed.     Patient have been counseled extensively about nutrition and exercise  Return in about 3 months (around 03/09/2021) for see PCP.  The patient was given clear instructions to go to ER or return to medical center if symptoms don't improve, worsen or new problems develop. The patient verbalized understanding. The patient was told to call to get lab results if they haven't heard anything in the next week.     Georgian Co, PA-C [facility] and [facility]ness [place], [place] [phone]   12/07/2020, 1:56 PM

## 2020-12-07 NOTE — Patient Instructions (Addendum)
Hypertension, Adult Hypertension is another name for high blood pressure. High blood pressure forces your heart to work harder to pump blood. This can cause problems overtime. There are two numbers in a blood pressure reading. There is a top number (systolic) over a bottom number (diastolic). It is best to have a blood pressure that is below 120/80. Healthy choicescan help lower your blood pressure, or you may need medicine to help lower it. What are the causes? The cause of this condition is not known. Some conditions may be related tohigh blood pressure. What increases the risk? Smoking. Having type 2 diabetes mellitus, high cholesterol, or both. Not getting enough exercise or physical activity. Being overweight. Having too much fat, sugar, calories, or salt (sodium) in your diet. Drinking too much alcohol. Having long-term (chronic) kidney disease. Having a family history of high blood pressure. Age. Risk increases with age. Race. You may be at higher risk if you are African American. Gender. Men are at higher risk than women before age 20. After age 72, women are at higher risk than men. Having obstructive sleep apnea. Stress. What are the signs or symptoms? High blood pressure may not cause symptoms. Very high blood pressure (hypertensive crisis) may cause: Headache. Feelings of worry or nervousness (anxiety). Shortness of breath. Nosebleed. A feeling of being sick to your stomach (nausea). Throwing up (vomiting). Changes in how you see. Very bad chest pain. Seizures. How is this treated? This condition is treated by making healthy lifestyle changes, such as: Eating healthy foods. Exercising more. Drinking less alcohol. Your health care provider may prescribe medicine if lifestyle changes are not enough to get your blood pressure under control, and if: Your top number is above 130. Your bottom number is above 80. Your personal target blood pressure may vary. Follow these  instructions at home: Eating and drinking  If told, follow the DASH eating plan. To follow this plan: Fill one half of your plate at each meal with fruits and vegetables. Fill one fourth of your plate at each meal with whole grains. Whole grains include whole-wheat pasta, brown rice, and whole-grain bread. Eat or drink low-fat dairy products, such as skim milk or low-fat yogurt. Fill one fourth of your plate at each meal with low-fat (lean) proteins. Low-fat proteins include fish, chicken without skin, eggs, beans, and tofu. Avoid fatty meat, cured and processed meat, or chicken with skin. Avoid pre-made or processed food. Eat less than 1,500 mg of salt each day. Do not drink alcohol if: Your doctor tells you not to drink. You are pregnant, may be pregnant, or are planning to become pregnant. If you drink alcohol: Limit how much you use to: 0-1 drink a day for women. 0-2 drinks a day for men. Be aware of how much alcohol is in your drink. In the [place] one drink equals one 12 oz bottle of beer (355 mL), one 5 oz glass of wine (148 mL), or one 1 oz glass of hard liquor (44 mL).  Lifestyle  Work with your doctor to stay at a healthy weight or to lose weight. Ask your doctor what the best weight is for you. Get at least 30 minutes of exercise most days of the week. This may include walking, swimming, or biking. Get at least 30 minutes of exercise that strengthens your muscles (resistance exercise) at least 3 days a week. This may include lifting weights or doing Pilates. Do not use any products that contain nicotine or tobacco, such as cigarettes,  as cigarettes, e-cigarettes, and chewing tobacco. If you need help quitting, ask your doctor. Check your blood pressure at home as told by your doctor. Keep all follow-up visits as told by your doctor. This is important. Medicines Take over-the-counter and prescription medicines only as told by your doctor. Follow directions carefully. Do not skip doses of  blood pressure medicine. The medicine does not work as well if you skip doses. Skipping doses also puts you at risk for problems. Ask your doctor about side effects or reactions to medicines that you should watch for. Contact a doctor if you: Think you are having a reaction to the medicine you are taking. Have headaches that keep coming back (recurring). Feel dizzy. Have swelling in your ankles. Have trouble with your vision. Get help right away if you: Get a very bad headache. Start to feel mixed up (confused). Feel weak or numb. Feel faint. Have very bad pain in your: Chest. Belly (abdomen). Throw up more than once. Have trouble breathing. Summary Hypertension is another name for high blood pressure. High blood pressure forces your heart to work harder to pump blood. For most people, a normal blood pressure is less than 120/80. Making healthy choices can help lower blood pressure. If your blood pressure does not get lower with healthy choices, you may need to take medicine. This information is not intended to replace advice given to you by your health care provider. Make sure you discuss any questions you have with your health care provider. Document Revised: 01/22/2018 Document Reviewed: 01/22/2018 Elsevier Patient Education  2022 [employer]. Gastroesophageal Reflux Disease, Adult Gastroesophageal reflux (GER) happens when acid from the stomach flows up into the tube that connects the mouth and the stomach (esophagus). Normally, food travels down the esophagus and stays in the stomach to be digested. With GER, food and stomach acid sometimes move back up into the esophagus. You may have a disease called gastroesophageal reflux disease (GERD) if the reflux: Happens often. Causes frequent or very bad symptoms. Causes problems such as damage to the esophagus. When this happens, the esophagus becomes sore and swollen. Over time, GERD can make small holes (ulcers) in the lining of  the esophagus. What are the causes? This condition is caused by a problem with the muscle between the esophagus and the stomach. When this muscle is weak or not normal, it does not close properly to keep food and acid from coming back up from the stomach. The muscle can be weak because of: Tobacco use. Pregnancy. Having a certain type of hernia (hiatal hernia). Alcohol use. Certain foods and drinks, such as coffee, chocolate, onions, and peppermint. What increases the risk? Being overweight. Having a disease that affects your connective tissue. Taking NSAIDs, such a ibuprofen. What are the signs or symptoms? Heartburn. Difficult or painful swallowing. The feeling of having a lump in the throat. A bitter taste in the mouth. Bad breath. Having a lot of saliva. Having an upset or bloated stomach. Burping. Chest pain. Different conditions can cause chest pain. Make sure you see your doctor if you have chest pain. Shortness of breath or wheezing. A long-term cough or a cough at night. Wearing away of the surface of teeth (tooth enamel). Weight loss. How is this treated? Making changes to your diet. Taking medicine. Having surgery. Treatment will depend on how bad your symptoms are. Follow these instructions at home: Eating and drinking  Follow a diet as told by your doctor. You may need to avoid foods   as: Coffee and tea, with or without caffeine. Drinks that contain alcohol. Energy drinks and sports drinks. Bubbly (carbonated) drinks or sodas. Chocolate and cocoa. Peppermint and mint flavorings. Garlic and onions. Horseradish. Spicy and acidic foods. These include peppers, chili powder, curry powder, vinegar, hot sauces, and BBQ sauce. Citrus fruit juices and citrus fruits, such as oranges, lemons, and limes. Tomato-based foods. These include red sauce, chili, salsa, and pizza with red sauce. Fried and fatty foods. These include donuts, french fries, potato  chips, and high-fat dressings. High-fat meats. These include hot dogs, rib eye steak, sausage, ham, and bacon. High-fat dairy items, such as whole milk, butter, and cream cheese. Eat small meals often. Avoid eating large meals. Avoid drinking large amounts of liquid with your meals. Avoid eating meals during the 2-3 hours before bedtime. Avoid lying down right after you eat. Do not exercise right after you eat.  Lifestyle  Do not smoke or use any products that contain nicotine or tobacco. If you need help quitting, ask your doctor. Try to lower your stress. If you need help doing this, ask your doctor. If you are overweight, lose an amount of weight that is healthy for you. Ask your doctor about a safe weight loss goal.  General instructions Pay attention to any changes in your symptoms. Take over-the-counter and prescription medicines only as told by your doctor. Do not take aspirin, ibuprofen, or other NSAIDs unless your doctor says it is okay. Wear loose clothes. Do not wear anything tight around your waist. Raise (elevate) the head of your bed about 6 inches (15 cm). You may need to use a wedge to do this. Avoid bending over if this makes your symptoms worse. Keep all follow-up visits. Contact a doctor if: You have new symptoms. You lose weight and you do not know why. You have trouble swallowing or it hurts to swallow. You have wheezing or a cough that keeps happening. You have a hoarse voice. Your symptoms do not get better with treatment. Get help right away if: You have sudden pain in your arms, neck, jaw, teeth, or back. You suddenly feel sweaty, dizzy, or light-headed. You have chest pain or shortness of breath. You vomit and the vomit is green, yellow, or black, or it looks like blood or coffee grounds. You faint. Your poop (stool) is red, bloody, or black. You cannot swallow, drink, or eat. These symptoms may represent a serious problem that is an emergency. Do not  wait to see if the symptoms will go away. Get medical help right away. Call your local emergency services (911 in the [place] Do not drive yourself to the hospital. Summary If a person has gastroesophageal reflux disease (GERD), food and stomach acid move back up into the esophagus and cause symptoms or problems such as damage to the esophagus. Treatment will depend on how bad your symptoms are. Follow a diet as told by your doctor. Take all medicines only as told by your doctor. This information is not intended to replace advice given to you by your health care provider. Make sure you discuss any questions you have with your healthcare provider. Document Revised: 11/23/2019 Document Reviewed: 11/23/2019 Elsevier Patient Education  2022 [employer].

## 2020-12-08 ENCOUNTER — Other Ambulatory Visit: Payer: Self-pay

## 2020-12-08 LAB — COMPREHENSIVE METABOLIC PANEL
ALT: 52 IU/L — ABNORMAL HIGH (ref 0–44)
AST: 42 IU/L — ABNORMAL HIGH (ref 0–40)
Albumin/Globulin Ratio: 1.4 (ref 1.2–2.2)
Albumin: 4 g/dL (ref 3.8–4.9)
Alkaline Phosphatase: 66 IU/L (ref 44–121)
BUN/Creatinine Ratio: 6 — ABNORMAL LOW (ref 9–20)
BUN: 9 mg/dL (ref 6–24)
Bilirubin Total: <0.2 mg/dL (ref 0.0–1.2)
CO2: 25 mmol/L (ref 20–29)
Calcium: 9.1 mg/dL (ref 8.7–10.2)
Chloride: 103 mmol/L (ref 96–106)
Creatinine, Ser: 1.42 mg/dL — ABNORMAL HIGH (ref 0.76–1.27)
Globulin, Total: 2.9 g/dL (ref 1.5–4.5)
Glucose: 94 mg/dL (ref 65–99)
Potassium: 3.8 mmol/L (ref 3.5–5.2)
Sodium: 141 mmol/L (ref 134–144)
Total Protein: 6.9 g/dL (ref 6.0–8.5)
eGFR: 60 mL/min/{1.73_m2} (ref >59)

## 2020-12-08 LAB — CBC WITH DIFFERENTIAL/PLATELET
Basophils Absolute: 0 10*3/uL (ref 0.0–0.2)
Basos: 0 %
EOS (ABSOLUTE): 0.1 10*3/uL (ref 0.0–0.4)
Eos: 1 %
Hematocrit: 42.6 % (ref 37.5–51.0)
Hemoglobin: 14.4 g/dL (ref 13.0–17.7)
Immature Grans (Abs): 0.1 10*3/uL (ref 0.0–0.1)
Immature Granulocytes: 1 %
Lymphocytes Absolute: 2.3 10*3/uL (ref 0.7–3.1)
Lymphs: 19 %
MCH: 31 pg (ref 26.6–33.0)
MCHC: 33.8 g/dL (ref 31.5–35.7)
MCV: 92 fL (ref 79–97)
Monocytes Absolute: 1.6 10*3/uL — ABNORMAL HIGH (ref 0.1–0.9)
Monocytes: 12 %
Neutrophils Absolute: 8.4 10*3/uL — ABNORMAL HIGH (ref 1.4–7.0)
Neutrophils: 67 %
Platelets: 346 10*3/uL (ref 150–450)
RBC: 4.64 x10E6/uL (ref 4.14–5.80)
RDW: 13.2 % (ref 11.6–15.4)
WBC: 12.6 10*3/uL — ABNORMAL HIGH (ref 3.4–10.8)

## 2020-12-09 LAB — H. PYLORI BREATH TEST: H pylori Breath Test: NEGATIVE

## 2021-01-11 ENCOUNTER — Other Ambulatory Visit: Payer: Self-pay

## 2021-01-11 MED FILL — Carbamazepine Tab 200 MG: ORAL | 30 days supply | Qty: 60 | Fill #3 | Status: AC

## 2021-01-13 ENCOUNTER — Other Ambulatory Visit: Payer: Self-pay

## 2021-01-16 ENCOUNTER — Telehealth: Payer: Self-pay | Admitting: *Deleted

## 2021-01-16 DIAGNOSIS — G40909 Epilepsy, unspecified, not intractable, without status epilepticus: Secondary | ICD-10-CM

## 2021-01-16 NOTE — Telephone Encounter (Signed)
Copied from CRM [id number]. Topic: General - Other >> Jan 12, 2021 12:38 PM Jaquita Rector A wrote: Reason for CRM: Patient called in to inform Dr Alvis Lemmings that he still have the cough said that after he completed the antibiotics the cough came back. Concerned also that he may not be able to get refills if he does not see the Dr in September but he was told that since he have an appointment scheduled for October he should be ok. Please advise and call patient at Ph# [phone]

## 2021-01-17 ENCOUNTER — Other Ambulatory Visit: Payer: Self-pay

## 2021-01-17 MED ORDER — CETIRIZINE HCL 10 MG PO TABS
10.0000 mg | ORAL_TABLET | Freq: Every day | ORAL | 1 refills | Status: DC
Start: 2021-01-17 — End: 2021-03-16
  Filled 2021-01-17: qty 30, 30d supply, fill #0

## 2021-01-17 MED ORDER — CARBAMAZEPINE 200 MG PO TABS
ORAL_TABLET | Freq: Two times a day (BID) | ORAL | 0 refills | Status: DC
  Filled 2021-01-17: qty 60, fill #0
  Filled 2021-02-10: qty 60, 30d supply, fill #0

## 2021-01-17 NOTE — Telephone Encounter (Signed)
Pt still having cough.

## 2021-01-17 NOTE — Addendum Note (Signed)
Addended by: Hoy Register on: 01/17/2021 12:44 PM   Modules accepted: Orders

## 2021-01-17 NOTE — Telephone Encounter (Signed)
I have sent Zyrtec to his pharmacy and also refilled his Tegretol.

## 2021-01-18 ENCOUNTER — Other Ambulatory Visit: Payer: Self-pay

## 2021-01-25 ENCOUNTER — Other Ambulatory Visit: Payer: Self-pay

## 2021-01-31 ENCOUNTER — Other Ambulatory Visit: Payer: Self-pay

## 2021-02-01 ENCOUNTER — Other Ambulatory Visit: Payer: Self-pay

## 2021-02-10 ENCOUNTER — Other Ambulatory Visit: Payer: Self-pay

## 2021-02-13 ENCOUNTER — Other Ambulatory Visit: Payer: Self-pay

## 2021-03-16 ENCOUNTER — Ambulatory Visit: Payer: Self-pay | Attending: Family Medicine | Admitting: Family Medicine

## 2021-03-16 ENCOUNTER — Other Ambulatory Visit: Payer: Self-pay

## 2021-03-16 ENCOUNTER — Encounter: Payer: Self-pay | Admitting: Family Medicine

## 2021-03-16 VITALS — BP 132/76 | HR 72 | Ht 66.0 in | Wt 221.6 lb

## 2021-03-16 DIAGNOSIS — G40909 Epilepsy, unspecified, not intractable, without status epilepticus: Secondary | ICD-10-CM

## 2021-03-16 DIAGNOSIS — Z1211 Encounter for screening for malignant neoplasm of colon: Secondary | ICD-10-CM

## 2021-03-16 DIAGNOSIS — Z125 Encounter for screening for malignant neoplasm of prostate: Secondary | ICD-10-CM

## 2021-03-16 DIAGNOSIS — R0982 Postnasal drip: Secondary | ICD-10-CM

## 2021-03-16 DIAGNOSIS — N182 Chronic kidney disease, stage 2 (mild): Secondary | ICD-10-CM

## 2021-03-16 MED ORDER — CARBAMAZEPINE 200 MG PO TABS
ORAL_TABLET | Freq: Two times a day (BID) | ORAL | 0 refills | Status: DC
  Filled 2021-03-16: qty 60, 30d supply, fill #0

## 2021-03-16 MED ORDER — CETIRIZINE HCL 10 MG PO TABS
10.0000 mg | ORAL_TABLET | Freq: Every day | ORAL | 1 refills | Status: DC
  Filled 2021-03-16: qty 30, 30d supply, fill #0

## 2021-03-16 NOTE — Progress Notes (Signed)
Has cough at night.

## 2021-03-16 NOTE — Progress Notes (Signed)
Subjective:  Patient ID: Jason Flowers, male    DOB: 1970/12/12  Age: 50 y.o. MRN: [id number]  CC: Cough   HPI Jason Flowers is a 50 y.o. year old male with a history of seizures, Stage II chronic kidney disease and mild obesity who presents today for a follow-up visit.   Interval History: He coughs at night and sometimes it is productive of sputum.  Denies presence of sinus pressure, sinus pain, rhinorrhea or nasal congestion but does have some postnasal drip.  He does not smoke. He has no recent seizures and is doing well on Tegretol.  Continues eat healthy and works out regularly. Past Medical History:  Diagnosis Date   Seizure disorder ([facility])    Seizures ([place])    since child     No past surgical history on file.  Family History  Problem Relation Age of Onset   Coronary artery disease Mother    Cancer Father        pancreatic   Cancer Sister    Seizures Sister    Seizures Son     Allergies  Allergen Reactions   Shrimp Flavor [Flavoring Agent]     swelling    Outpatient Medications Prior to Visit  Medication Sig Dispense Refill   carbamazepine (TEGRETOL) 200 MG tablet TAKE 1 TABLET (200 MG TOTAL) BY MOUTH 2 (TWO) TIMES DAILY. 60 tablet 0   nystatin (MYCOSTATIN) 100000 UNIT/ML suspension Take 5 mLs (500,000 Units total) by mouth 2 (two) times a day. (Patient not taking: No sig reported) 60 mL 0   omeprazole (PRILOSEC) 20 MG capsule Take 1 capsule (20 mg total) by mouth daily. (Patient not taking: Reported on 03/16/2021) 30 capsule 3   cetirizine (ZYRTEC) 10 MG tablet Take 1 tablet (10 mg total) by mouth daily. (Patient not taking: Reported on 03/16/2021) 30 tablet 1   No facility-administered medications prior to visit.     ROS Review of Systems  Constitutional:  Negative for activity change and appetite change.  HENT:  Negative for sinus pressure and sore throat.   Eyes:  Negative for visual disturbance.  Respiratory:  Positive for cough. Negative for chest  tightness and shortness of breath.   Cardiovascular:  Negative for chest pain and leg swelling.  Gastrointestinal:  Negative for abdominal distention, abdominal pain, constipation and diarrhea.  Endocrine: Negative.   Genitourinary:  Negative for dysuria.  Musculoskeletal:  Negative for joint swelling and myalgias.  Skin:  Negative for rash.  Allergic/Immunologic: Negative.   Neurological:  Negative for weakness, light-headedness and numbness.  Psychiatric/Behavioral:  Negative for dysphoric mood and suicidal ideas.    Objective:  BP 132/76   Pulse 72   Ht '5\' 6"'  (1.676 m)   Wt 221 lb 9.6 oz (100.5 kg)   SpO2 98%   BMI 35.77 kg/m   BP/Weight 03/16/2021 12/07/2020 9/39/0300  Systolic BP 923 300 762  Diastolic BP 76 86 69  Wt. (Lbs) 221.6 221.2 207  BMI 35.77 35.7 35.53      Physical Exam Constitutional:      Appearance: He is well-developed.  HENT:     Right Ear: Tympanic membrane normal.     Left Ear: Tympanic membrane normal.     Mouth/Throat:     Mouth: Mucous membranes are moist.  Cardiovascular:     Rate and Rhythm: Normal rate.     Heart sounds: Normal heart sounds. No murmur heard. Pulmonary:     Effort: Pulmonary effort is normal.  Breath sounds: Normal breath sounds. No wheezing or rales.  Chest:     Chest wall: No tenderness.  Abdominal:     General: Bowel sounds are normal. There is no distension.     Palpations: Abdomen is soft. There is no mass.     Tenderness: There is no abdominal tenderness.  Musculoskeletal:        General: Normal range of motion.     Right lower leg: No edema.     Left lower leg: No edema.  Neurological:     Mental Status: He is alert and oriented to person, place, and time.  Psychiatric:        Mood and Affect: Mood normal.    CMP Latest Ref Rng & Units 12/07/2020 06/23/2020 09/22/2019  Glucose 65 - 99 mg/dL 94 80 92  BUN 6 - 24 mg/dL '9 15 18  ' Creatinine 0.76 - 1.27 mg/dL 1.42(H) 1.43(H) 1.58(H)  Sodium 134 - 144 mmol/L  141 141 141  Potassium 3.5 - 5.2 mmol/L 3.8 4.3 4.2  Chloride 96 - 106 mmol/L 103 105 104  CO2 20 - 29 mmol/L '25 23 26  ' Calcium 8.7 - 10.2 mg/dL 9.1 9.4 9.4  Total Protein 6.0 - 8.5 g/dL 6.9 7.1 7.3  Total Bilirubin 0.0 - 1.2 mg/dL <0.2 0.3 0.6  Alkaline Phos 44 - 121 IU/L 66 51 85  AST 0 - 40 IU/L 42(H) 32 38  ALT 0 - 44 IU/L 52(H) 29 34    Lipid Panel     Component Value Date/Time   CHOL 162 09/22/2019 1019   TRIG 48 09/22/2019 1019   HDL 59 09/22/2019 1019   CHOLHDL 2.7 09/22/2019 1019   CHOLHDL 2.6 05/10/2016 0905   VLDL 10 05/10/2016 0905   LDLCALC 93 09/22/2019 1019    CBC    Component Value Date/Time   WBC 12.6 (H) 12/07/2020 1406   WBC 5.3 08/06/2016 1201   RBC 4.64 12/07/2020 1406   RBC 4.88 08/06/2016 1201   HGB 14.4 12/07/2020 1406   HCT 42.6 12/07/2020 1406   PLT 346 12/07/2020 1406   MCV 92 12/07/2020 1406   MCH 31.0 12/07/2020 1406   MCH 30.3 08/06/2016 1201   MCHC 33.8 12/07/2020 1406   MCHC 34.0 08/06/2016 1201   RDW 13.2 12/07/2020 1406   LYMPHSABS 2.3 12/07/2020 1406   MONOABS 795 08/06/2016 1201   EOSABS 0.1 12/07/2020 1406   BASOSABS 0.0 12/07/2020 1406    Lab Results  Component Value Date   HGBA1C 5.5 (A) 12/08/2018    Assessment & Plan:  1. Seizure disorder ([facility]) Controlled Continue current management - Carbamazepine level, total - LP+Non-HDL Cholesterol - CMP14+EGFR - carbamazepine (TEGRETOL) 200 MG tablet; TAKE 1 TABLET (200 MG TOTAL) BY MOUTH 2 (TWO) TIMES DAILY.  Dispense: 60 tablet; Refill: 0  2. Screening for colon cancer - Fecal occult blood, imunochemical([facility])  3. Stage 2 chronic kidney disease Last creatinine was 1.42 in 11/2020 Will repeat level today  4. Post-nasal drip Could explain his cough Will place an antihistamine - cetirizine (ZYRTEC) 10 MG tablet; Take 1 tablet (10 mg total) by mouth daily.  Dispense: 30 tablet; Refill: 1  5. Screening for prostate cancer - PSA, total and free    Meds  ordered this encounter  Medications   carbamazepine (TEGRETOL) 200 MG tablet    Sig: TAKE 1 TABLET (200 MG TOTAL) BY MOUTH 2 (TWO) TIMES DAILY.    Dispense:  60 tablet    Refill:  0   cetirizine (ZYRTEC) 10 MG tablet    Sig: Take 1 tablet (10 mg total) by mouth daily.    Dispense:  30 tablet    Refill:  1    Follow-up: Return in about 6 months (around 09/14/2021).       Charlott Rakes, MD, FAAFP. [facility] [place], [place] [place]   03/16/2021, 1:11 PM

## 2021-03-17 ENCOUNTER — Other Ambulatory Visit: Payer: Self-pay

## 2021-03-17 ENCOUNTER — Other Ambulatory Visit: Payer: Self-pay | Admitting: Family Medicine

## 2021-03-17 DIAGNOSIS — R7989 Other specified abnormal findings of blood chemistry: Secondary | ICD-10-CM

## 2021-03-17 LAB — CMP14+EGFR
ALT: 118 IU/L — ABNORMAL HIGH (ref 0–44)
AST: 71 IU/L — ABNORMAL HIGH (ref 0–40)
Albumin/Globulin Ratio: 1.4 (ref 1.2–2.2)
Albumin: 4.4 g/dL (ref 3.8–4.9)
Alkaline Phosphatase: 51 IU/L (ref 44–121)
BUN/Creatinine Ratio: 5 — ABNORMAL LOW (ref 9–20)
BUN: 9 mg/dL (ref 6–24)
Bilirubin Total: 0.5 mg/dL (ref 0.0–1.2)
CO2: 24 mmol/L (ref 20–29)
Calcium: 9.4 mg/dL (ref 8.7–10.2)
Chloride: 101 mmol/L (ref 96–106)
Creatinine, Ser: 1.64 mg/dL — ABNORMAL HIGH (ref 0.76–1.27)
Globulin, Total: 3.1 g/dL (ref 1.5–4.5)
Glucose: 85 mg/dL (ref 70–99)
Potassium: 4.8 mmol/L (ref 3.5–5.2)
Sodium: 138 mmol/L (ref 134–144)
Total Protein: 7.5 g/dL (ref 6.0–8.5)
eGFR: 50 mL/min/{1.73_m2} — ABNORMAL LOW (ref >59)

## 2021-03-17 LAB — PSA, TOTAL AND FREE
PSA, Free Pct: 20 %
PSA, Free: 0.2 ng/mL
Prostate Specific Ag, Serum: 1 ng/mL (ref 0.0–4.0)

## 2021-03-17 LAB — LP+NON-HDL CHOLESTEROL
Cholesterol, Total: 165 mg/dL (ref 100–199)
HDL: 24 mg/dL — ABNORMAL LOW (ref >39)
LDL Chol Calc (NIH): 127 mg/dL — ABNORMAL HIGH (ref 0–99)
Total Non-HDL-Chol (LDL+VLDL): 141 mg/dL — ABNORMAL HIGH (ref 0–129)
Triglycerides: 72 mg/dL (ref 0–149)
VLDL Cholesterol Cal: 14 mg/dL (ref 5–40)

## 2021-03-17 LAB — CARBAMAZEPINE LEVEL, TOTAL: Carbamazepine (Tegretol), S: 6.8 ug/mL (ref 4.0–12.0)

## 2021-03-28 ENCOUNTER — Telehealth: Payer: Self-pay

## 2021-03-28 NOTE — Telephone Encounter (Signed)
-----   Message from Hoy Register, MD sent at 03/17/2021  1:47 PM EDT ----- PSA is normal.  Please schedule liver ultrasound to follow-up on his elevated liver enzymes.

## 2021-03-28 NOTE — Telephone Encounter (Signed)
-----   Message from Hoy Register, MD sent at 03/17/2021  1:46 PM EDT ----- Please inform him that his cholesterol is elevated compared to previous labs.  He will need to work on a low-cholesterol diet and if levels remain elevated at next visit we will need to commence medication.  Liver enzymes are slightly elevated and I have ordered ultrasound to further evaluate this as sometimes fatty liver could be contributing especially since his cholesterol is elevated.  His carbamazepine can also cause elevated liver enzymes but he has been on this chronically with normal liver enzymes.His creatinine also shows a slight bump.  Advised to continue with increased fluid intake and avoid NSAIDs.

## 2021-03-28 NOTE — Telephone Encounter (Signed)
Patient name and DOB has been verified Patient was informed of lab results. Patient had no questions.  

## 2021-04-03 ENCOUNTER — Ambulatory Visit (HOSPITAL_COMMUNITY)
Admission: RE | Admit: 2021-04-03 | Discharge: 2021-04-03 | Disposition: A | Discharge status: Home / Self Care | Payer: Self-pay | Source: Ambulatory Visit | Attending: Family Medicine | Admitting: Family Medicine

## 2021-04-03 ENCOUNTER — Other Ambulatory Visit: Payer: Self-pay

## 2021-04-03 DIAGNOSIS — R7989 Other specified abnormal findings of blood chemistry: Secondary | ICD-10-CM | POA: Insufficient documentation

## 2021-04-04 ENCOUNTER — Other Ambulatory Visit: Payer: Self-pay | Admitting: Family Medicine

## 2021-04-04 DIAGNOSIS — R7989 Other specified abnormal findings of blood chemistry: Secondary | ICD-10-CM

## 2021-04-06 ENCOUNTER — Telehealth (INDEPENDENT_AMBULATORY_CARE_PROVIDER_SITE_OTHER): Payer: Self-pay

## 2021-04-06 NOTE — Telephone Encounter (Signed)
Pt was called and informed of lab results. 

## 2021-04-06 NOTE — Telephone Encounter (Signed)
Copied from CRM [id number]. Topic: General - Other >> Apr 06, 2021 12:47 PM Traci Sermon wrote: Reason for CRM: Pt called in stating he wanted someone to tell him his imaging results and requested a call back, please advise.

## 2021-04-17 ENCOUNTER — Other Ambulatory Visit: Payer: Self-pay | Admitting: Family Medicine

## 2021-04-17 DIAGNOSIS — G40909 Epilepsy, unspecified, not intractable, without status epilepticus: Secondary | ICD-10-CM

## 2021-04-18 NOTE — Telephone Encounter (Signed)
Requested medications are due for refill today yes  Requested medications are on the active medication list yes  Last refill 03/17/21  Last visit 03/16/21  Future visit scheduled 09/14/21  Notes to clinic not delegated, CBC greater than 90 days ago, please assess. Requested Prescriptions  Pending Prescriptions Disp Refills   carbamazepine (TEGRETOL) 200 MG tablet 60 tablet 0    Sig: TAKE 1 TABLET (200 MG TOTAL) BY MOUTH 2 (TWO) TIMES DAILY.     Not Delegated - Neurology:  Anticonvulsants - carbamazepine Failed - 04/17/2021  9:55 PM      Failed - This refill cannot be delegated      Failed - AST in normal range and within 90 days    AST  Date Value Ref Range Status  03/16/2021 71 (H) 0 - 40 IU/L Final          Failed - ALT in normal range and within 90 days    ALT  Date Value Ref Range Status  03/16/2021 118 (H) 0 - 44 IU/L Final          Failed - WBC in normal range and within 90 days    WBC  Date Value Ref Range Status  12/07/2020 12.6 (H) 3.4 - 10.8 x10E3/uL Final  08/06/2016 5.3 3.8 - 10.8 K/uL Final          Failed - PLT in normal range and within 90 days    Platelets  Date Value Ref Range Status  12/07/2020 346 150 - 450 x10E3/uL Final          Failed - HGB in normal range and within 90 days    Hemoglobin  Date Value Ref Range Status  12/07/2020 14.4 13.0 - 17.7 g/dL Final          Failed - HCT in normal range and within 90 days    Hematocrit  Date Value Ref Range Status  12/07/2020 42.6 37.5 - 51.0 % Final          Passed - Carbamazepine (serum) in normal range and within 360 days    Carbamazepine, Total  Date Value Ref Range Status  06/23/2020 7.3 4.0 - 12.0 ug/mL Final    Comment:             In conjunction with other antiepileptic drugs                                Therapeutic  4.0 -  8.0                                Toxicity     9.0 - 12.0                                    Carbamazepine alone                                 Therapeutic  8.0 - 12.0                                 Detection Limit =  0.5                           <  0.5 indicated None Detected    Carbamazepine (Tegretol), S  Date Value Ref Range Status  03/16/2021 6.8 4.0 - 12.0 ug/mL Final    Comment:             In conjunction with other antiepileptic drugs                                Therapeutic  4.0 -  8.0                                Toxicity     9.0 - 12.0                                    Carbamazepine alone                                Therapeutic  8.0 - 12.0                                 Detection Limit =  2.0                           <2.0 indicated None Detected           Passed - Na in normal range and within 90 days    Sodium  Date Value Ref Range Status  03/16/2021 138 134 - 144 mmol/L Final          Passed - Valid encounter within last 12 months    Recent Outpatient Visits           1 month ago Seizure disorder [facility])   [place] Charlott Rakes, MD   4 months ago Cough   [place], [place], [place]   9 months ago Stage 2 chronic kidney disease   [place], [place], [place]   1 year ago Stage 2 chronic kidney disease   [place], [place], MD   2 years ago Oral thrush   [place], Enobong, MD       Future Appointments             In 4 months Charlott Rakes, MD [place]

## 2021-04-19 ENCOUNTER — Other Ambulatory Visit: Payer: Self-pay

## 2021-04-19 MED ORDER — CARBAMAZEPINE 200 MG PO TABS
ORAL_TABLET | Freq: Two times a day (BID) | ORAL | 2 refills | Status: DC
  Filled 2021-04-19 – 2021-06-05 (×3): qty 60, 30d supply, fill #0
  Filled 2021-06-05 – 2021-07-13 (×2): qty 60, 30d supply, fill #1

## 2021-04-26 ENCOUNTER — Other Ambulatory Visit: Payer: Self-pay

## 2021-04-27 ENCOUNTER — Other Ambulatory Visit: Payer: Self-pay

## 2021-05-16 ENCOUNTER — Other Ambulatory Visit: Payer: Self-pay

## 2021-05-16 ENCOUNTER — Ambulatory Visit: Payer: Self-pay | Attending: Family Medicine

## 2021-05-16 VITALS — BP 132/76 | Ht 66.0 in | Wt 221.0 lb

## 2021-05-16 DIAGNOSIS — R7989 Other specified abnormal findings of blood chemistry: Secondary | ICD-10-CM

## 2021-05-16 DIAGNOSIS — Z23 Encounter for immunization: Secondary | ICD-10-CM

## 2021-05-17 LAB — HCV AB W REFLEX TO QUANT PCR: HCV Ab: <0.1 s/co ratio (ref 0.0–0.9)

## 2021-05-17 LAB — CMP14+EGFR
ALT: 31 IU/L (ref 0–44)
AST: 37 IU/L (ref 0–40)
Albumin/Globulin Ratio: 1.4 (ref 1.2–2.2)
Albumin: 4.2 g/dL (ref 3.8–4.9)
Alkaline Phosphatase: 66 IU/L (ref 44–121)
BUN/Creatinine Ratio: 8 — ABNORMAL LOW (ref 9–20)
BUN: 13 mg/dL (ref 6–24)
Bilirubin Total: 0.3 mg/dL (ref 0.0–1.2)
CO2: 26 mmol/L (ref 20–29)
Calcium: 9.2 mg/dL (ref 8.7–10.2)
Chloride: 102 mmol/L (ref 96–106)
Creatinine, Ser: 1.56 mg/dL — ABNORMAL HIGH (ref 0.76–1.27)
Globulin, Total: 2.9 g/dL (ref 1.5–4.5)
Glucose: 83 mg/dL (ref 70–99)
Potassium: 4.4 mmol/L (ref 3.5–5.2)
Sodium: 141 mmol/L (ref 134–144)
Total Protein: 7.1 g/dL (ref 6.0–8.5)
eGFR: 53 mL/min/{1.73_m2} — ABNORMAL LOW (ref >59)

## 2021-05-17 LAB — HCV INTERPRETATION

## 2021-05-17 LAB — CERULOPLASMIN: Ceruloplasmin: 26.5 mg/dL (ref 16.0–31.0)

## 2021-05-17 LAB — MITOCHONDRIAL ANTIBODIES: Mitochondrial Ab: <20 Units (ref 0.0–20.0)

## 2021-05-17 LAB — HEPATITIS B SURFACE ANTIGEN: Hepatitis B Surface Ag: NEGATIVE

## 2021-05-17 LAB — GAMMA GT: GGT: 20 IU/L (ref 0–65)

## 2021-05-18 ENCOUNTER — Telehealth: Payer: Self-pay

## 2021-05-18 NOTE — Telephone Encounter (Signed)
Patient name and DOB has been verified Patient was informed of lab results. Patient had no questions.  

## 2021-05-18 NOTE — Telephone Encounter (Signed)
-----   Message from Hoy Register, MD sent at 05/17/2021  1:41 PM EST ----- Please inform him that his liver functions have normalized.  Kidney function is slightly abnormal but has improved compared to last set of labs.  Nothing additional needs to be done at this time.

## 2021-06-06 ENCOUNTER — Other Ambulatory Visit: Payer: Self-pay

## 2021-06-08 ENCOUNTER — Other Ambulatory Visit: Payer: Self-pay

## 2021-06-27 ENCOUNTER — Other Ambulatory Visit (HOSPITAL_COMMUNITY): Payer: Self-pay

## 2021-07-13 ENCOUNTER — Other Ambulatory Visit: Payer: Self-pay

## 2021-07-14 ENCOUNTER — Other Ambulatory Visit: Payer: Self-pay

## 2021-08-21 ENCOUNTER — Other Ambulatory Visit: Payer: Self-pay

## 2021-08-21 ENCOUNTER — Other Ambulatory Visit: Payer: Self-pay | Admitting: Family Medicine

## 2021-08-21 DIAGNOSIS — G40909 Epilepsy, unspecified, not intractable, without status epilepticus: Secondary | ICD-10-CM

## 2021-08-23 ENCOUNTER — Other Ambulatory Visit: Payer: Self-pay

## 2021-08-23 MED ORDER — CARBAMAZEPINE 200 MG PO TABS
ORAL_TABLET | Freq: Two times a day (BID) | ORAL | 2 refills | Status: DC
  Filled 2021-08-23: qty 60, 30d supply, fill #0

## 2021-08-23 NOTE — Telephone Encounter (Signed)
Requested Prescriptions  ?Pending Prescriptions Disp Refills  ?? carbamazepine (TEGRETOL) 200 MG tablet 60 tablet 2  ?  Sig: TAKE 1 TABLET (200 MG TOTAL) BY MOUTH 2 (TWO) TIMES DAILY.  ?  ? Neurology:  Anticonvulsants - carbamazepine Failed - 08/21/2021 12:23 PM  ?  ?  Failed - WBC in normal range and within 360 days  ?  WBC  ?Date Value Ref Range Status  ?12/07/2020 12.6 (H) 3.4 - 10.8 x10E3/uL Final  ?08/06/2016 5.3 3.8 - 10.8 K/uL Final  ?   ?  ?  Failed - Cr in normal range and within 360 days  ?  Creat  ?Date Value Ref Range Status  ?08/06/2016 1.48 (H) 0.60 - 1.35 mg/dL Final  ? ?Creatinine, Ser  ?Date Value Ref Range Status  ?05/16/2021 1.56 (H) 0.76 - 1.27 mg/dL Final  ? ?Creatinine,U  ?Date Value Ref Range Status  ?03/01/2008 441.4 mg/dL Final  ?  Comment:  ?  See lab report for associated comment(s)  ?   ?  ?  Passed - AST in normal range and within 360 days  ?  AST  ?Date Value Ref Range Status  ?05/16/2021 37 0 - 40 IU/L Final  ?   ?  ?  Passed - ALT in normal range and within 360 days  ?  ALT  ?Date Value Ref Range Status  ?05/16/2021 31 0 - 44 IU/L Final  ?   ?  ?  Passed - Carbamazepine (serum) in normal range and within 360 days  ?  Carbamazepine, Total  ?Date Value Ref Range Status  ?06/23/2020 7.3 4.0 - 12.0 ug/mL Final  ?  Comment:  ?           In conjunction with other antiepileptic drugs ?                               Therapeutic  4.0 -  8.0 ?                               Toxicity     9.0 - 12.0 ?                                   Carbamazepine alone ?                               Therapeutic  8.0 - 12.0 ?                                Detection Limit =  0.5 ?                          <0.5 indicated None Detected ?  ? ?Carbamazepine (Tegretol), S  ?Date Value Ref Range Status  ?03/16/2021 6.8 4.0 - 12.0 ug/mL Final  ?  Comment:  ?           In conjunction with other antiepileptic drugs ?                               Therapeutic  4.0 -  8.0 ?  Toxicity     9.0 -  12.0 ?                                   Carbamazepine alone ?                               Therapeutic  8.0 - 12.0 ?                                Detection Limit =  2.0 ?                          <2.0 indicated None Detected ?  ?   ?  ?  Passed - PLT in normal range and within 360 days  ?  Platelets  ?Date Value Ref Range Status  ?12/07/2020 346 150 - 450 x10E3/uL Final  ?   ?  ?  Passed - HGB in normal range and within 360 days  ?  Hemoglobin  ?Date Value Ref Range Status  ?12/07/2020 14.4 13.0 - 17.7 g/dL Final  ?   ?  ?  Passed - Na in normal range and within 360 days  ?  Sodium  ?Date Value Ref Range Status  ?05/16/2021 141 134 - 144 mmol/L Final  ?   ?  ?  Passed - HCT in normal range and within 360 days  ?  Hematocrit  ?Date Value Ref Range Status  ?12/07/2020 42.6 37.5 - 51.0 % Final  ?   ?  ?  Passed - Completed PHQ-2 or PHQ-9 in the last 360 days  ?  ?  Passed - Valid encounter within last 12 months  ?  Recent Outpatient Visits   ?      ? 5 months ago Seizure disorder ([place])  ? [place], Charlane Ferretti, [place]  ? 8 months ago Cough  ? [place], [place], [place]  ? 1 year ago Stage 2 chronic kidney disease  ? [place], [place], [place]  ? 1 year ago Stage 2 chronic kidney disease  ? [place], [place]  ? 2 years ago Oral thrush  ? [place] Charlott Rakes, MD  ?  ?  ?Future Appointments   ?        ? In 3 weeks Charlott Rakes, MD [place]  ?  ? ?  ?  ?  ? ? ?

## 2021-09-14 ENCOUNTER — Other Ambulatory Visit: Payer: Self-pay

## 2021-09-14 ENCOUNTER — Encounter: Payer: Self-pay | Admitting: Family Medicine

## 2021-09-14 ENCOUNTER — Ambulatory Visit: Payer: Self-pay | Attending: Family Medicine | Admitting: Family Medicine

## 2021-09-14 VITALS — BP 116/70 | HR 72 | Ht 66.0 in | Wt 204.0 lb

## 2021-09-14 DIAGNOSIS — Z13228 Encounter for screening for other metabolic disorders: Secondary | ICD-10-CM

## 2021-09-14 DIAGNOSIS — G40909 Epilepsy, unspecified, not intractable, without status epilepticus: Secondary | ICD-10-CM

## 2021-09-14 DIAGNOSIS — Z125 Encounter for screening for malignant neoplasm of prostate: Secondary | ICD-10-CM

## 2021-09-14 DIAGNOSIS — Z1211 Encounter for screening for malignant neoplasm of colon: Secondary | ICD-10-CM

## 2021-09-14 DIAGNOSIS — N1831 Chronic kidney disease, stage 3a: Secondary | ICD-10-CM

## 2021-09-14 MED ORDER — CARBAMAZEPINE 200 MG PO TABS
ORAL_TABLET | Freq: Two times a day (BID) | ORAL | 1 refills | Status: DC
  Filled 2021-09-14 – 2021-09-27 (×2): qty 180, 90d supply, fill #0
  Filled 2022-01-25: qty 180, 90d supply, fill #1

## 2021-09-14 NOTE — Patient Instructions (Signed)
Allergies, Adult An allergy is a condition in which the body's defense system (immune system) comes in contact with an allergen and reacts to it. An allergen is anything that causes an allergic reaction. Allergens cause the immune system to make proteins for fighting infections (antibodies). These antibodies cause cells to release chemicals called histamines that set off the symptoms of an allergic reaction. Allergies often affect the nasal passages (allergic rhinitis), eyes (allergic conjunctivitis), skin (atopic dermatitis), and stomach. Allergies can be mild, moderate, or severe. They cannot spread from person to person. Allergies can develop at any age and may be outgrown. What are the causes? This condition is caused by allergens. Common allergens include: Outdoor allergens, such as pollen, car fumes, and mold. Indoor allergens, such as dust, smoke, mold, and pet dander. Other allergens, such as foods, medicines, scents, insect bites or stings, and other skin irritants. What increases the risk? You are more likely to develop this condition if you have: Family members with allergies. Family members who have any condition that may be caused by allergens, such as asthma. This may make you more likely to have other allergies. What are the signs or symptoms? Symptoms of this condition depend on the severity of the allergy. Mild to moderate symptoms Runny nose, stuffy nose (nasal congestion), or sneezing. Itchy mouth, ears, or throat. A feeling of mucus dripping down the back of your throat (postnasal drip). Sore throat. Itchy, red, watery, or puffy eyes. Skin rash, or itchy, red, swollen areas of skin (hives). Stomach cramps or bloating. Severe symptoms Severe allergies to food, medicine, or insect bites may cause anaphylaxis, which can be life-threatening. Symptoms include: A red (flushed) face. Wheezing or coughing. Swollen lips, tongue, or mouth. Tight or swollen throat. Chest pain or  tightness, or rapid heartbeat. Trouble breathing or shortness of breath. Pain in the abdomen, vomiting, or diarrhea. Dizziness or fainting. How is this diagnosed? This condition is diagnosed based on your symptoms, your family and medical history, and a physical exam. You may also have tests, including: Skin tests to see how your skin reacts to allergens that may be causing your symptoms. Tests include: Skin prick test. For this test, an allergen is introduced to your body through a small opening in the skin. Intradermal skin test. For this test, a small amount of allergen is injected under the first layer of your skin. Patch test. For this test, a small amount of allergen is placed on your skin. The area is covered and then checked after a few days. Blood tests. A challenge test. For this test, you will eat or breathe in a small amount of allergen to see if you have an allergic reaction. You may also be asked to: Keep a food diary. This is a record of all the foods, drinks, and symptoms you have in a day. Try an elimination diet. To do this: Remove certain foods from your diet. Add those foods back one by one to find out if any foods cause an allergic reaction. How is this treated?     Treatment for allergies depends on your symptoms. Treatment may include: Cold, wet cloths (cold compresses) to soothe itching and swelling. Eye drops or nasal sprays. Nasal irrigation to help clear your mucus or keep the nasal passages moist. A humidifier to add moisture to the air. Skin creams to treat rashes or itching. Oral antihistamines or other medicines to block the reaction or to treat inflammation. Diet changes to remove foods that cause allergies. Being   exposed again and again to tiny amounts of allergens to help you build a defense against it (tolerance). This is called immunotherapy. Examples include: Allergy shot. You receive an injection that contains an allergen. Sublingual immunotherapy.  You take a small dose of allergen under your tongue. Emergency injection for anaphylaxis. You give yourself a shot using a syringe (auto-injector) that contains the amount of medicine you need. Your health care provider will teach you how to give yourself an injection. Follow these instructions at home: Medicines  Take or apply over-the-counter and prescription medicines only as told by your health care provider. Always carry your auto-injector pen if you are at risk of anaphylaxis. Give yourself an injection as told by your health care provider. Eating and drinking Follow instructions from your health care provider about eating or drinking restrictions. Drink enough fluid to keep your urine pale yellow. General instructions Wear a medical alert bracelet or necklace to let others know that you have had anaphylaxis before. Avoid known allergens whenever possible. Keep all follow-up visits as told by your health care provider. This is important. Contact a health care provider if: Your symptoms do not get better with treatment. Get help right away if: You have symptoms of anaphylaxis. These include: Swollen mouth, tongue, or throat. Pain or tightness in your chest. Trouble breathing or shortness of breath. Dizziness or fainting. Severe abdominal pain, vomiting, or diarrhea. These symptoms may represent a serious problem that is an emergency. Do not wait to see if the symptoms will go away. Get medical help right away. Call your local emergency services (911 in the [place] Do not drive yourself to the hospital. Summary Take or apply over-the-counter and prescription medicines only as told by your health care provider. Avoid known allergens when possible. Always carry your auto-injector pen if you are at risk of anaphylaxis. Give yourself an injection as told by your health care provider. Wear a medical alert bracelet or necklace to let others know that you have had anaphylaxis  before. Anaphylaxis is a life-threatening emergency. Get help right away. This information is not intended to replace advice given to you by your health care provider. Make sure you discuss any questions you have with your health care provider. Document Revised: 01/11/2020 Document Reviewed: 03/25/2019 Elsevier Patient Education  2023 Elsevier Inc.  

## 2021-09-14 NOTE — Progress Notes (Signed)
? ?Subjective:  ?Patient ID: Jason Flowers, male    DOB: 10-05-1970  Age: 51 y.o. MRN: [id number] ? ?CC: Seizures ? ? ?HPI ?Jason Flowers is a 51 y.o. year old male with a history of seizures, Stage III chronic kidney disease and mild obesity who presents today for a follow-up visit.  ? ?Interval History: ?He has had seasonal allergies and has been taking OTC remedies. He has had tickle in his throat, sneezing, coughing but no postnasal drip. ? ?His last set of blood work had returned to normal after previously elevated LFTs. ?RUQ ultrasound revealed: ?IMPRESSION: ?1. Normal evaluation of the liver. ?2. 3 mm echogenic nonshadowing focus in the dependent portion of the ?gallbladder, possible polyp or sludge ball. ?3. Increased right renal echogenicity, which may be associated with ?medical renal disease. ? ?His creatinine continues to remain elevated with a baseline of 1.5.  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 ?Compliant with Tegretol with no recent seizures. ?Past Medical History:  ?Diagnosis Date  ? Seizure disorder ([place])   ? Seizures ([place])   ? since child   ? ? ?History reviewed. No pertinent surgical history. ? ?Family History  ?Problem Relation Age of Onset  ? Coronary artery disease Mother   ? Cancer Father   ?     pancreatic  ? Cancer Sister   ? Seizures Sister   ? Seizures Son   ? ? ?Social History  ? ?Socioeconomic History  ? Marital status: Single  ?  Spouse name: Not on file  ? Number of children: Not on file  ? Years of education: Not on file  ? Highest education level: Not on file  ?Occupational History  ? Not on file  ?Tobacco Use  ? Smoking status: Never  ? Smokeless tobacco: Never  ?Substance and Sexual Activity  ? Alcohol use: No  ?  Alcohol/week: 0.0 standard drinks  ? Drug use: No  ? Sexual activity: Not on file  ?Other Topics Concern  ? Not on file  ?Social History Narrative  ? Not on file  ? ?Social Determinants of Health  ? ?Financial Resource Strain: Not on file  ?Food  Insecurity: Not on file  ?Transportation Needs: Not on file  ?Physical Activity: Not on file  ?Stress: Not on file  ?Social Connections: Not on file  ? ? ?Allergies  ?Allergen Reactions  ? Shrimp Flavor [Flavoring Agent]   ?  swelling  ? ? ?Outpatient Medications Prior to Visit  ?Medication Sig Dispense Refill  ? cetirizine (ZYRTEC) 10 MG tablet Take 1 tablet (10 mg total) by mouth daily. 30 tablet 1  ? carbamazepine (TEGRETOL) 200 MG tablet TAKE 1 TABLET (200 MG TOTAL) BY MOUTH 2 (TWO) TIMES DAILY. 60 tablet 2  ? nystatin (MYCOSTATIN) 100000 UNIT/ML suspension Take 5 mLs (500,000 Units total) by mouth 2 (two) times a day. (Patient not taking: Reported on 09/16/2019) 60 mL 0  ? omeprazole (PRILOSEC) 20 MG capsule Take 1 capsule (20 mg total) by mouth daily. (Patient not taking: Reported on 03/16/2021) 30 capsule 3  ? ?No facility-administered medications prior to visit.  ? ? ? ?ROS ?Review of Systems  ?Constitutional:  Negative for activity change and appetite change.  ?HENT:  Positive for sneezing. Negative for sinus pressure and sore throat.   ?Eyes:  Negative for visual disturbance.  ?Respiratory:  Negative for cough, chest tightness and shortness of breath.   ?Cardiovascular:  Negative for chest pain and leg swelling.  ?Gastrointestinal:  Negative for abdominal distention, abdominal pain, constipation and diarrhea.  ?Endocrine: Negative.   ?Genitourinary:  Negative for dysuria.  ?Musculoskeletal:  Negative for joint swelling and myalgias.  ?Skin:  Negative for rash.  ?Allergic/Immunologic: Negative.   ?Neurological:  Negative for weakness, light-headedness and numbness.  ?Psychiatric/Behavioral:  Negative for dysphoric mood and suicidal ideas.   ? ?Objective:  ?BP 116/70   Pulse 72   Ht '5\' 6"'  (1.676 m)   Wt 204 lb (92.5 kg)   SpO2 96%   BMI 32.93 kg/m?  ? ? ?  09/14/2021  ? 11:37 AM 05/17/2021  ?  9:00 AM 03/16/2021  ?  9:31 AM  ?BP/Weight  ?Systolic BP 409 811 914  ?Diastolic BP 70 76 76  ?Wt. (Lbs) 204 221  221.6  ?BMI 32.93 kg/m2 35.67 kg/m2 35.77 kg/m2  ? ? ? ? ?Physical Exam ?Constitutional:   ?   Appearance: He is well-developed.  ?Cardiovascular:  ?   Rate and Rhythm: Normal rate.  ?   Heart sounds: Normal heart sounds. No murmur heard. ?Pulmonary:  ?   Effort: Pulmonary effort is normal.  ?   Breath sounds: Normal breath sounds. No wheezing or rales.  ?Chest:  ?   Chest wall: No tenderness.  ?Abdominal:  ?   General: Bowel sounds are normal. There is no distension.  ?   Palpations: Abdomen is soft. There is no mass.  ?   Tenderness: There is no abdominal tenderness.  ?Musculoskeletal:     ?   General: Normal range of motion.  ?   Right lower leg: No edema.  ?   Left lower leg: No edema.  ?Neurological:  ?   Mental Status: He is alert and oriented to person, place, and time.  ?Psychiatric:     ?   Mood and Affect: Mood normal.  ? ? ? ?  Latest Ref Rng & Units 05/16/2021  ?  9:27 AM 03/16/2021  ? 10:32 AM 12/07/2020  ?  2:06 PM  ?CMP  ?Glucose 70 - 99 mg/dL 83   85   94    ?BUN 6 - 24 mg/dL '13   9   9    ' ?Creatinine 0.76 - 1.27 mg/dL 1.56   1.64   1.42    ?Sodium 134 - 144 mmol/L 141   138   141    ?Potassium 3.5 - 5.2 mmol/L 4.4   4.8   3.8    ?Chloride 96 - 106 mmol/L 102   101   103    ?CO2 20 - 29 mmol/L '26   24   25    ' ?Calcium 8.7 - 10.2 mg/dL 9.2   9.4   9.1    ?Total Protein 6.0 - 8.5 g/dL 7.1   7.5   6.9    ?Total Bilirubin 0.0 - 1.2 mg/dL 0.3   0.5   <0.2    ?Alkaline Phos 44 - 121 IU/L 66   51   66    ?AST 0 - 40 IU/L 37   71   42    ?ALT 0 - 44 IU/L 31   118   52    ? ? ?Lipid Panel  ?   ?Component Value Date/Time  ? CHOL 165 03/16/2021 1032  ? TRIG 72 03/16/2021 1032  ? HDL 24 (L) 03/16/2021 1032  ? CHOLHDL 2.7 09/22/2019 1019  ? CHOLHDL 2.6 05/10/2016 0905  ? VLDL 10 05/10/2016 0905  ? LDLCALC 127 (H) 03/16/2021 1032  ? ? ?  CBC ?   ?Component Value Date/Time  ? WBC 12.6 (H) 12/07/2020 1406  ? WBC 5.3 08/06/2016 1201  ? RBC 4.64 12/07/2020 1406  ? RBC 4.88 08/06/2016 1201  ? HGB 14.4 12/07/2020 1406  ?  HCT 42.6 12/07/2020 1406  ? PLT 346 12/07/2020 1406  ? MCV 92 12/07/2020 1406  ? MCH 31.0 12/07/2020 1406  ? MCH 30.3 08/06/2016 1201  ? MCHC 33.8 12/07/2020 1406  ? MCHC 34.0 08/06/2016 1201  ? RDW 13.2 12/07/2020 1406  ? LYMPHSABS 2.3 12/07/2020 1406  ? MONOABS 795 08/06/2016 1201  ? EOSABS 0.1 12/07/2020 1406  ? BASOSABS 0.0 12/07/2020 1406  ? ? ?Lab Results  ?Component Value Date  ? HGBA1C 5.5 (A) 12/08/2018  ? ? ?Assessment & Plan:  ?1. Seizure disorder ([place]) ?Seizure-free ?- carbamazepine (TEGRETOL) 200 MG tablet; TAKE 1 TABLET (200 MG TOTAL) BY MOUTH 2 (TWO) TIMES DAILY.  Dispense: 180 tablet; Refill: 1 ? ?2. Screening for metabolic disorder ?- LP+Non-HDL Cholesterol ?- CMP14+EGFR ?- CBC with Differential/Platelet ? ?3. Screening for prostate cancer ?- PSA, total and free ? ?4. Screening for colon cancer ?- Fecal occult blood, imunochemical([facility]) ? ?5. Stage 3a chronic kidney disease ([place]) ?Creatinine has been stable ?Elevation likely due to large muscle mass as he is a Airline pilot ?Medical renal disease noted on RUQ ultrasound hence I will order renal ultrasound and also refer to nephrology ?- Ambulatory referral to Nephrology ?- US Renal; Future ? ? ? ?Meds ordered this encounter  ?Medications  ? carbamazepine (TEGRETOL) 200 MG tablet  ?  Sig: TAKE 1 TABLET (200 MG TOTAL) BY MOUTH 2 (TWO) TIMES DAILY.  ?  Dispense:  180 tablet  ?  Refill:  1  ? ? ?Follow-up: Return in about 6 months (around 03/16/2022) for Chronic medical conditions.  ? ? ? ? ? ?Charlott Rakes, MD, FAAFP. ?[place] ?[place], [place] ?[phone]   ?09/14/2021, 12:17 PM ?

## 2021-09-15 LAB — CMP14+EGFR
ALT: 19 IU/L (ref 0–44)
AST: 29 IU/L (ref 0–40)
Albumin/Globulin Ratio: 1.6 (ref 1.2–2.2)
Albumin: 4.4 g/dL (ref 3.8–4.9)
Alkaline Phosphatase: 81 IU/L (ref 44–121)
BUN/Creatinine Ratio: 8 — ABNORMAL LOW (ref 9–20)
BUN: 12 mg/dL (ref 6–24)
Bilirubin Total: 0.5 mg/dL (ref 0.0–1.2)
CO2: 26 mmol/L (ref 20–29)
Calcium: 9.1 mg/dL (ref 8.7–10.2)
Chloride: 101 mmol/L (ref 96–106)
Creatinine, Ser: 1.43 mg/dL — ABNORMAL HIGH (ref 0.76–1.27)
Globulin, Total: 2.8 g/dL (ref 1.5–4.5)
Glucose: 82 mg/dL (ref 70–99)
Potassium: 5.2 mmol/L (ref 3.5–5.2)
Sodium: 138 mmol/L (ref 134–144)
Total Protein: 7.2 g/dL (ref 6.0–8.5)
eGFR: 59 mL/min/{1.73_m2} — ABNORMAL LOW (ref >59)

## 2021-09-15 LAB — CBC WITH DIFFERENTIAL/PLATELET
Basophils Absolute: 0 10*3/uL (ref 0.0–0.2)
Basos: 1 %
EOS (ABSOLUTE): 0.2 10*3/uL (ref 0.0–0.4)
Eos: 4 %
Hematocrit: 49 % (ref 37.5–51.0)
Hemoglobin: 16 g/dL (ref 13.0–17.7)
Immature Grans (Abs): 0 10*3/uL (ref 0.0–0.1)
Immature Granulocytes: 1 %
Lymphocytes Absolute: 1.4 10*3/uL (ref 0.7–3.1)
Lymphs: 21 %
MCH: 30.1 pg (ref 26.6–33.0)
MCHC: 32.7 g/dL (ref 31.5–35.7)
MCV: 92 fL (ref 79–97)
Monocytes Absolute: 0.9 10*3/uL (ref 0.1–0.9)
Monocytes: 13 %
Neutrophils Absolute: 4 10*3/uL (ref 1.4–7.0)
Neutrophils: 60 %
Platelets: 264 10*3/uL (ref 150–450)
RBC: 5.32 x10E6/uL (ref 4.14–5.80)
RDW: 13.4 % (ref 11.6–15.4)
WBC: 6.6 10*3/uL (ref 3.4–10.8)

## 2021-09-15 LAB — PSA, TOTAL AND FREE
PSA, Free Pct: 30 %
PSA, Free: 0.24 ng/mL
Prostate Specific Ag, Serum: 0.8 ng/mL (ref 0.0–4.0)

## 2021-09-15 LAB — LP+NON-HDL CHOLESTEROL
Cholesterol, Total: 150 mg/dL (ref 100–199)
HDL: 52 mg/dL (ref >39)
LDL Chol Calc (NIH): 89 mg/dL (ref 0–99)
Total Non-HDL-Chol (LDL+VLDL): 98 mg/dL (ref 0–129)
Triglycerides: 42 mg/dL (ref 0–149)
VLDL Cholesterol Cal: 9 mg/dL (ref 5–40)

## 2021-09-21 ENCOUNTER — Other Ambulatory Visit: Payer: Self-pay

## 2021-09-25 ENCOUNTER — Ambulatory Visit (HOSPITAL_COMMUNITY): Payer: Self-pay

## 2021-09-27 ENCOUNTER — Other Ambulatory Visit: Payer: Self-pay

## 2022-01-25 ENCOUNTER — Other Ambulatory Visit: Payer: Self-pay

## 2022-01-26 ENCOUNTER — Other Ambulatory Visit: Payer: Self-pay

## 2022-03-14 ENCOUNTER — Other Ambulatory Visit: Payer: Self-pay

## 2022-03-14 ENCOUNTER — Ambulatory Visit: Payer: Self-pay | Attending: Family Medicine | Admitting: Family Medicine

## 2022-03-14 ENCOUNTER — Encounter: Payer: Self-pay | Admitting: Family Medicine

## 2022-03-14 VITALS — BP 128/84 | HR 67 | Temp 97.9°F | Ht 66.0 in | Wt 207.8 lb

## 2022-03-14 DIAGNOSIS — Z1211 Encounter for screening for malignant neoplasm of colon: Secondary | ICD-10-CM

## 2022-03-14 DIAGNOSIS — Z131 Encounter for screening for diabetes mellitus: Secondary | ICD-10-CM

## 2022-03-14 DIAGNOSIS — G40909 Epilepsy, unspecified, not intractable, without status epilepticus: Secondary | ICD-10-CM

## 2022-03-14 DIAGNOSIS — R0982 Postnasal drip: Secondary | ICD-10-CM

## 2022-03-14 DIAGNOSIS — Z1329 Encounter for screening for other suspected endocrine disorder: Secondary | ICD-10-CM

## 2022-03-14 DIAGNOSIS — N1831 Chronic kidney disease, stage 3a: Secondary | ICD-10-CM

## 2022-03-14 MED ORDER — CARBAMAZEPINE 200 MG PO TABS
ORAL_TABLET | Freq: Two times a day (BID) | ORAL | 1 refills | Status: DC
  Filled 2022-03-14: qty 180, fill #0
  Filled 2022-05-18: qty 180, 90d supply, fill #0
  Filled 2022-10-18: qty 180, 90d supply, fill #1

## 2022-03-14 MED ORDER — CETIRIZINE HCL 10 MG PO TABS
10.0000 mg | ORAL_TABLET | Freq: Every day | ORAL | 1 refills | Status: DC
  Filled 2022-03-14: qty 30, 30d supply, fill #0

## 2022-03-14 NOTE — Patient Instructions (Signed)
Colorectal Cancer Screening  Colorectal cancer screening is a group of tests that are used to check for colorectal cancer before symptoms develop. Colorectal refers to the colon and rectum. The colon and rectum are located at the end of the digestive tract and carry stool (feces) out of the body. Who should have screening? All adults who are 45-52 years old should have screening. Your health care provider may recommend screening before age 45. You will have tests every 1-10 years, depending on your results and the type of screening test. Screening recommendations for adults who are 76-85 years old vary depending on a person's health. People older than age 85 should no longer get colorectal cancer screening. You may have screening tests starting before age 45, or more often than other people, if you have any of these risk factors: A personal or family history of colorectal cancer or abnormal growths known as polyps in your colon. Inflammatory bowel disease, such as ulcerative colitis or Crohn's disease. A history of having radiation treatment to the abdomen or the area between the hip bones (pelvic area) for cancer. A type of genetic syndrome that is passed from parent to child (hereditary), such as: Lynch syndrome. Familial adenomatous polyposis. Turcot syndrome. Peutz-Jeghers syndrome. MUTYH-associated polyposis (MAP). A personal history of diabetes. Types of tests There are several types of colorectal screening tests. You may have one or more of the following: Guaiac-based fecal occult blood testing. For this test, a stool sample is checked for hidden (occult) blood, which could be a sign of colorectal cancer. Fecal immunochemical test (FIT). For this test, a stool sample is checked for blood, which could be a sign of colorectal cancer. Stool DNA test. For this test, a stool sample is checked for blood and changes in DNA that could lead to colorectal cancer. Sigmoidoscopy. During this test, a  thin, flexible tube with a camera on the end, called a sigmoidoscope, is used to examine the rectum and the lower colon. Colonoscopy. During this test, a long, flexible tube with a camera on the end, called a colonoscope, is used to examine the entire colon and rectum. Also, sometimes a tissue sample is taken to be looked at under a microscope (biopsy) or small polyps are removed during this test. Virtual colonoscopy. Instead of a colonoscope, this type of colonoscopy uses a CT scan to take pictures of the colon and rectum. A CT scan is a type of X-ray that is made using computers. What are the benefits of screening? Screening reduces your risk for colorectal cancer and can help identify cancer at an early stage, when the cancer can be removed or treated more easily. It is common for polyps to form in the lining of the colon, especially as you age. These polyps may be cancerous or become cancerous over time. Screening can identify these polyps. What are the risks of screening? Generally, these are safe tests. However, problems may occur, including: The need for more tests to confirm results from a stool sample test. Stool sample tests have fewer risks than other types of screening tests. Being exposed to low levels of radiation, if you had a test involving X-rays. This may slightly increase your cancer risk. The benefit of detecting cancer outweighs the slight increase in risk. Bleeding, damage to the intestine, or infection caused by a sigmoidoscopy or colonoscopy. A reaction to medicines given during a sigmoidoscopy or colonoscopy. Talk with your health care provider to understand your risk for colorectal cancer and to make a   screening plan that is right for you. Questions to ask your health care provider When should I start colorectal cancer screening? What is my risk for colorectal cancer? How often do I need screening? Which screening tests do I need? How do I get my test results? What do my  results mean? Where to find more information Learn more about colorectal cancer screening from: The American Cancer Society: cancer.org National Cancer Institute: cancer.gov Summary Colorectal cancer screening is a group of tests used to check for colorectal cancer before symptoms develop. All adults who are 45-52 years old should have screening. Your health care provider may recommend screening before age 45. You may have screening tests starting before age 45, or more often than other people, if you have certain risk factors. Screening reduces your risk for colorectal cancer and can help identify cancer at an early stage, when the cancer can be removed or treated more easily. Talk with your health care provider to understand your risk for colorectal cancer and to make a screening plan that is right for you. This information is not intended to replace advice given to you by your health care provider. Make sure you discuss any questions you have with your health care provider. Document Revised: 09/02/2019 Document Reviewed: 09/02/2019 Elsevier Patient Education  2023 Elsevier Inc.  

## 2022-03-14 NOTE — Progress Notes (Signed)
Subjective:  Patient ID: Jason Flowers, male    DOB: 1971-04-22  Age: 51 y.o. MRN: [id number]  CC: Chronic Kidney Disease   HPI Jason Flowers is a 51 y.o. year old male with a history of seizures, Stage III chronic kidney disease and mild obesity who presents today for a follow-up visit.  Interval History: He continues to remain seizure-free and is adherent with his Tegretol. Endorses exercising regularly by means of going to the gym but states he does not think he drinks adequate amount of water. He uses Claritin as needed for postnasal drip. 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 He is requesting to have his testosterone levels checked.  Past Medical History:  Diagnosis Date   Seizure disorder ([place])    Seizures ([place])    since child     No past surgical history on file.  Family History  Problem Relation Age of Onset   Coronary artery disease Mother    Cancer Father        pancreatic   Cancer Sister    Seizures Sister    Seizures Son     Social History   Socioeconomic History   Marital status: Single    Spouse name: Not on file   Number of children: Not on file   Years of education: Not on file   Highest education level: Not on file  Occupational History   Not on file  Tobacco Use   Smoking status: Never   Smokeless tobacco: Never  Substance and Sexual Activity   Alcohol use: No    Alcohol/week: 0.0 standard drinks of alcohol   Drug use: No   Sexual activity: Not on file  Other Topics Concern   Not on file  Social History Narrative   Not on file   Social Determinants of Health   Financial Resource Strain: Not on file  Food Insecurity: Not on file  Transportation Needs: Not on file  Physical Activity: Not on file  Stress: Not on file  Social Connections: Not on file    Allergies  Allergen Reactions   Shrimp Flavor  [Flavoring Agent]     swelling    Outpatient Medications Prior to Visit  Medication Sig Dispense Refill   carbamazepine (TEGRETOL) 200 MG tablet TAKE 1 TABLET (200 MG TOTAL) BY MOUTH 2 (TWO) TIMES DAILY. 180 tablet 1   cetirizine (ZYRTEC) 10 MG tablet Take 1 tablet (10 mg total) by mouth daily. 30 tablet 1   No facility-administered medications prior to visit.     ROS Review of Systems  Constitutional:  Negative for activity change and appetite change.  HENT:  Negative for sinus pressure and sore throat.   Respiratory:  Negative for chest tightness, shortness of breath and wheezing.   Cardiovascular:  Negative for chest pain and palpitations.  Gastrointestinal:  Negative for abdominal distention, abdominal pain and constipation.  Genitourinary: Negative.   Musculoskeletal: Negative.   Psychiatric/Behavioral:  Negative for behavioral problems and dysphoric mood.     Objective:  BP 128/84   Pulse 67   Temp 97.9 F (36.6 C)   Ht '5\' 6"'  (1.676 m)   Wt 207 lb 12.8 oz (94.3 kg)   SpO2 97%   BMI 33.54 kg/m      03/14/2022    9:39 AM 09/14/2021   11:37 AM 05/17/2021    9:00  AM  BP/Weight  Systolic BP 625 638 937  Diastolic BP 84 70 76  Wt. (Lbs) 207.8 204 221  BMI 33.54 kg/m2 32.93 kg/m2 35.67 kg/m2      Physical Exam Constitutional:      Appearance: He is well-developed.  Cardiovascular:     Rate and Rhythm: Normal rate.     Heart sounds: Normal heart sounds. No murmur heard. Pulmonary:     Effort: Pulmonary effort is normal.     Breath sounds: Normal breath sounds. No wheezing or rales.  Chest:     Chest wall: No tenderness.  Abdominal:     General: Bowel sounds are normal. There is no distension.     Palpations: Abdomen is soft. There is no mass.     Tenderness: There is no abdominal tenderness.  Musculoskeletal:        General: Normal range of motion.     Right lower leg: No edema.     Left lower leg: No edema.  Neurological:     Mental Status: He is  alert and oriented to person, place, and time.  Psychiatric:        Mood and Affect: Mood normal.        Latest Ref Rng & Units 09/14/2021   12:17 PM 05/16/2021    9:27 AM 03/16/2021   10:32 AM  CMP  Glucose 70 - 99 mg/dL 82  83  85   BUN 6 - 24 mg/dL '12  13  9   ' Creatinine 0.76 - 1.27 mg/dL 1.43  1.56  1.64   Sodium 134 - 144 mmol/L 138  141  138   Potassium 3.5 - 5.2 mmol/L 5.2  4.4  4.8   Chloride 96 - 106 mmol/L 101  102  101   CO2 20 - 29 mmol/L '26  26  24   ' Calcium 8.7 - 10.2 mg/dL 9.1  9.2  9.4   Total Protein 6.0 - 8.5 g/dL 7.2  7.1  7.5   Total Bilirubin 0.0 - 1.2 mg/dL 0.5  0.3  0.5   Alkaline Phos 44 - 121 IU/L 81  66  51   AST 0 - 40 IU/L 29  37  71   ALT 0 - 44 IU/L 19  31  118     Lipid Panel     Component Value Date/Time   CHOL 150 09/14/2021 1217   TRIG 42 09/14/2021 1217   HDL 52 09/14/2021 1217   CHOLHDL 2.7 09/22/2019 1019   CHOLHDL 2.6 05/10/2016 0905   VLDL 10 05/10/2016 0905   LDLCALC 89 09/14/2021 1217    CBC    Component Value Date/Time   WBC 6.6 09/14/2021 1217   WBC 5.3 08/06/2016 1201   RBC 5.32 09/14/2021 1217   RBC 4.88 08/06/2016 1201   HGB 16.0 09/14/2021 1217   HCT 49.0 09/14/2021 1217   PLT 264 09/14/2021 1217   MCV 92 09/14/2021 1217   MCH 30.1 09/14/2021 1217   MCH 30.3 08/06/2016 1201   MCHC 32.7 09/14/2021 1217   MCHC 34.0 08/06/2016 1201   RDW 13.4 09/14/2021 1217   LYMPHSABS 1.4 09/14/2021 1217   MONOABS 795 08/06/2016 1201   EOSABS 0.2 09/14/2021 1217   BASOSABS 0.0 09/14/2021 1217    Lab Results  Component Value Date   HGBA1C 5.5 (A) 12/08/2018    Assessment & Plan:  1. Seizure disorder (HCC) Seizure-free Continue Tegretol - carbamazepine (TEGRETOL) 200 MG tablet; TAKE 1 TABLET (200 MG TOTAL)  BY MOUTH 2 (TWO) TIMES DAILY.  Dispense: 180 tablet; Refill: 1  2. Post-nasal drip Stable He uses his antihistamine intermittently - cetirizine (ZYRTEC) 10 MG tablet; Take 1 tablet (10 mg total) by mouth daily.   Dispense: 30 tablet; Refill: 1  3. Screening for colon cancer - Fecal occult blood, imunochemical([facility])  4. Screening for endocrine disorder - Testosterone, Free, Total, SHBG   5. Screening for diabetes mellitus - Hemoglobin A1c  6. Stage 3a chronic kidney disease (HCC) High muscle mass contributing Continue to avoid nephrotoxins Increase fluid intake - CMP14+EGFR    Meds ordered this encounter  Medications   carbamazepine (TEGRETOL) 200 MG tablet    Sig: TAKE 1 TABLET (200 MG TOTAL) BY MOUTH 2 (TWO) TIMES DAILY.    Dispense:  180 tablet    Refill:  1   cetirizine (ZYRTEC) 10 MG tablet    Sig: Take 1 tablet (10 mg total) by mouth daily.    Dispense:  30 tablet    Refill:  1    Follow-up: Return in about 6 months (around 09/13/2022) for Chronic medical conditions.       Charlott Rakes, MD, FAAFP. [facility] [place], [place]   03/14/2022, 12:49 PM

## 2022-03-19 ENCOUNTER — Ambulatory Visit: Payer: Self-pay | Admitting: Family Medicine

## 2022-03-19 LAB — CMP14+EGFR
ALT: 24 IU/L (ref 0–44)
AST: 31 IU/L (ref 0–40)
Albumin/Globulin Ratio: 1.6 (ref 1.2–2.2)
Albumin: 4.4 g/dL (ref 3.8–4.9)
Alkaline Phosphatase: 87 IU/L (ref 44–121)
BUN/Creatinine Ratio: 9 (ref 9–20)
BUN: 14 mg/dL (ref 6–24)
Bilirubin Total: 0.2 mg/dL (ref 0.0–1.2)
CO2: 24 mmol/L (ref 20–29)
Calcium: 9.2 mg/dL (ref 8.7–10.2)
Chloride: 104 mmol/L (ref 96–106)
Creatinine, Ser: 1.5 mg/dL — ABNORMAL HIGH (ref 0.76–1.27)
Globulin, Total: 2.8 g/dL (ref 1.5–4.5)
Glucose: 86 mg/dL (ref 70–99)
Potassium: 4.2 mmol/L (ref 3.5–5.2)
Sodium: 142 mmol/L (ref 134–144)
Total Protein: 7.2 g/dL (ref 6.0–8.5)
eGFR: 56 mL/min/{1.73_m2} — ABNORMAL LOW (ref >59)

## 2022-03-19 LAB — TESTOSTERONE, FREE, TOTAL, SHBG
Sex Hormone Binding: 100 nmol/L — ABNORMAL HIGH (ref 19.3–76.4)
Testosterone, Free: 3.6 pg/mL — ABNORMAL LOW (ref 7.2–24.0)
Testosterone: 640 ng/dL (ref 264–916)

## 2022-03-19 LAB — HEMOGLOBIN A1C
Est. average glucose Bld gHb Est-mCnc: 117 mg/dL
Hgb A1c MFr Bld: 5.7 % — ABNORMAL HIGH (ref 4.8–5.6)

## 2022-03-20 ENCOUNTER — Telehealth: Payer: Self-pay

## 2022-03-20 DIAGNOSIS — R7989 Other specified abnormal findings of blood chemistry: Secondary | ICD-10-CM

## 2022-03-20 NOTE — Telephone Encounter (Signed)
Pt states the he wants to get the referral to Urology he states that he will apply for the OC/CAFA but he wants the referral placed now and he will inquire of the cost.

## 2022-03-21 ENCOUNTER — Other Ambulatory Visit: Payer: Self-pay

## 2022-05-03 ENCOUNTER — Other Ambulatory Visit (HOSPITAL_COMMUNITY): Payer: Self-pay

## 2022-05-18 ENCOUNTER — Other Ambulatory Visit: Payer: Self-pay

## 2022-05-24 ENCOUNTER — Other Ambulatory Visit: Payer: Self-pay

## 2022-08-06 ENCOUNTER — Telehealth: Payer: Self-pay | Admitting: *Deleted

## 2022-08-06 NOTE — Telephone Encounter (Signed)
Pt states he remembers getting an iFOBT kit in the past (03/16/21, 09/14/21, & 03/14/22) - Pt also has upcoming appt with Dr. Margarita Rana on 09/13/22 at 10:50A. Pt states he is willing to look for his last iFOBT test packet at home and complete it 1-2 days before his upcoming appt on 09/13/22 to see Dr. Margarita Rana. He verbalizes understanding test can detect possible issues before becoming cancer as well as detect cancer and he is willing to complete the test. If pt cannot find most recent test kit, he states he will ask Dr. Margarita Rana for another kit during his next appt, and then return that kit so that it can be processed within the 6 day limit. Pt agreed to letter reminder and thanked caller for her help.

## 2022-09-03 ENCOUNTER — Ambulatory Visit: Payer: Self-pay | Admitting: Family Medicine

## 2022-09-13 ENCOUNTER — Ambulatory Visit: Payer: Self-pay | Admitting: Family Medicine

## 2022-09-13 ENCOUNTER — Ambulatory Visit: Payer: Medicaid Other | Admitting: Physician Assistant

## 2022-10-10 ENCOUNTER — Other Ambulatory Visit: Payer: Self-pay

## 2022-10-10 ENCOUNTER — Emergency Department (HOSPITAL_COMMUNITY)
Admission: EM | Admit: 2022-10-10 | Discharge: 2022-10-10 | Disposition: A | Discharge status: Home / Self Care | Payer: Medicaid Other | Attending: Emergency Medicine | Admitting: Emergency Medicine

## 2022-10-10 ENCOUNTER — Encounter (HOSPITAL_COMMUNITY): Payer: Self-pay | Admitting: Emergency Medicine

## 2022-10-10 ENCOUNTER — Emergency Department (HOSPITAL_COMMUNITY): Payer: Medicaid Other

## 2022-10-10 DIAGNOSIS — S30811A Abrasion of abdominal wall, initial encounter: Secondary | ICD-10-CM | POA: Diagnosis not present

## 2022-10-10 DIAGNOSIS — R0789 Other chest pain: Secondary | ICD-10-CM | POA: Diagnosis not present

## 2022-10-10 DIAGNOSIS — R0781 Pleurodynia: Secondary | ICD-10-CM | POA: Diagnosis not present

## 2022-10-10 DIAGNOSIS — T07XXXA Unspecified multiple injuries, initial encounter: Secondary | ICD-10-CM

## 2022-10-10 DIAGNOSIS — R109 Unspecified abdominal pain: Secondary | ICD-10-CM | POA: Diagnosis present

## 2022-10-10 DIAGNOSIS — Y92410 Unspecified street and highway as the place of occurrence of the external cause: Secondary | ICD-10-CM | POA: Diagnosis not present

## 2022-10-10 DIAGNOSIS — S199XXA Unspecified injury of neck, initial encounter: Secondary | ICD-10-CM | POA: Diagnosis not present

## 2022-10-10 DIAGNOSIS — S0990XA Unspecified injury of head, initial encounter: Secondary | ICD-10-CM | POA: Diagnosis not present

## 2022-10-10 DIAGNOSIS — S301XXA Contusion of abdominal wall, initial encounter: Secondary | ICD-10-CM | POA: Diagnosis not present

## 2022-10-10 LAB — CBC WITH DIFFERENTIAL/PLATELET
Abs Immature Granulocytes: 0.03 10*3/uL (ref 0.00–0.07)
Basophils Absolute: 0 10*3/uL (ref 0.0–0.1)
Basophils Relative: 1 %
Eosinophils Absolute: 0.5 10*3/uL (ref 0.0–0.5)
Eosinophils Relative: 9 %
HCT: 38.3 % — ABNORMAL LOW (ref 39.0–52.0)
Hemoglobin: 13 g/dL (ref 13.0–17.0)
Immature Granulocytes: 1 %
Lymphocytes Relative: 23 %
Lymphs Abs: 1.3 10*3/uL (ref 0.7–4.0)
MCH: 30.2 pg (ref 26.0–34.0)
MCHC: 33.9 g/dL (ref 30.0–36.0)
MCV: 89.1 fL (ref 80.0–100.0)
Monocytes Absolute: 0.8 10*3/uL (ref 0.1–1.0)
Monocytes Relative: 14 %
Neutro Abs: 2.9 10*3/uL (ref 1.7–7.7)
Neutrophils Relative %: 52 %
Platelets: 193 10*3/uL (ref 150–400)
RBC: 4.3 MIL/uL (ref 4.22–5.81)
RDW: 12.6 % (ref 11.5–15.5)
WBC: 5.5 10*3/uL (ref 4.0–10.5)
nRBC: 0 % (ref 0.0–0.2)

## 2022-10-10 LAB — COMPREHENSIVE METABOLIC PANEL
ALT: 27 U/L (ref 0–44)
AST: 37 U/L (ref 15–41)
Albumin: 3.7 g/dL (ref 3.5–5.0)
Alkaline Phosphatase: 80 U/L (ref 38–126)
Anion gap: 10 (ref 5–15)
BUN: 20 mg/dL (ref 6–20)
CO2: 24 mmol/L (ref 22–32)
Calcium: 8.7 mg/dL — ABNORMAL LOW (ref 8.9–10.3)
Chloride: 105 mmol/L (ref 98–111)
Creatinine, Ser: 1.45 mg/dL — ABNORMAL HIGH (ref 0.61–1.24)
GFR, Estimated: 58 mL/min — ABNORMAL LOW (ref >60)
Glucose, Bld: 101 mg/dL — ABNORMAL HIGH (ref 70–99)
Potassium: 3.7 mmol/L (ref 3.5–5.1)
Sodium: 139 mmol/L (ref 135–145)
Total Bilirubin: 0.3 mg/dL (ref 0.3–1.2)
Total Protein: 6.8 g/dL (ref 6.5–8.1)

## 2022-10-10 LAB — URINALYSIS, ROUTINE W REFLEX MICROSCOPIC
Bilirubin Urine: NEGATIVE
Glucose, UA: NEGATIVE mg/dL
Hgb urine dipstick: NEGATIVE
Ketones, ur: NEGATIVE mg/dL
Leukocytes,Ua: NEGATIVE
Nitrite: NEGATIVE
Protein, ur: NEGATIVE mg/dL
Specific Gravity, Urine: 1.035 — ABNORMAL HIGH (ref 1.005–1.030)
pH: 7 (ref 5.0–8.0)

## 2022-10-10 MED ORDER — ACETAMINOPHEN 500 MG PO TABS
1000.0000 mg | ORAL_TABLET | Freq: Once | ORAL | Status: AC
  Administered 2022-10-10: 1000 mg via ORAL
  Filled 2022-10-10: qty 2

## 2022-10-10 MED ORDER — KETOROLAC TROMETHAMINE 15 MG/ML IJ SOLN
15.0000 mg | Freq: Once | INTRAMUSCULAR | Status: AC
  Administered 2022-10-10: 15 mg via INTRAVENOUS
  Filled 2022-10-10: qty 1

## 2022-10-10 MED ORDER — IOHEXOL 350 MG/ML SOLN
75.0000 mL | Freq: Once | INTRAVENOUS | Status: AC | PRN
  Administered 2022-10-10: 75 mL via INTRAVENOUS
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
## 2022-10-18 ENCOUNTER — Other Ambulatory Visit: Payer: Self-pay

## 2022-11-07 ENCOUNTER — Ambulatory Visit: Payer: Medicaid Other | Admitting: Physician Assistant

## 2023-01-17 ENCOUNTER — Ambulatory Visit: Payer: Medicaid Other | Admitting: Physician Assistant

## 2023-01-30 ENCOUNTER — Encounter: Payer: Self-pay | Admitting: Physician Assistant

## 2023-01-30 ENCOUNTER — Ambulatory Visit: Payer: Medicaid Other | Attending: Physician Assistant | Admitting: Physician Assistant

## 2023-01-30 ENCOUNTER — Other Ambulatory Visit: Payer: Self-pay

## 2023-01-30 VITALS — BP 124/85 | HR 76 | Wt 216.0 lb

## 2023-01-30 DIAGNOSIS — R7989 Other specified abnormal findings of blood chemistry: Secondary | ICD-10-CM | POA: Diagnosis not present

## 2023-01-30 DIAGNOSIS — R0982 Postnasal drip: Secondary | ICD-10-CM

## 2023-01-30 DIAGNOSIS — Z125 Encounter for screening for malignant neoplasm of prostate: Secondary | ICD-10-CM

## 2023-01-30 DIAGNOSIS — Z800 Family history of malignant neoplasm of digestive organs: Secondary | ICD-10-CM

## 2023-01-30 DIAGNOSIS — Z1211 Encounter for screening for malignant neoplasm of colon: Secondary | ICD-10-CM

## 2023-01-30 DIAGNOSIS — G40909 Epilepsy, unspecified, not intractable, without status epilepticus: Secondary | ICD-10-CM

## 2023-01-30 MED ORDER — CETIRIZINE HCL 10 MG PO TABS
10.0000 mg | ORAL_TABLET | Freq: Every day | ORAL | 1 refills | Status: DC
  Filled 2023-01-30: qty 30, 30d supply, fill #0

## 2023-01-30 MED ORDER — CARBAMAZEPINE 200 MG PO TABS
200.0000 mg | ORAL_TABLET | Freq: Two times a day (BID) | ORAL | 1 refills | Status: DC
Start: 2023-01-30 — End: 2023-10-30
  Filled 2023-01-30: qty 180, 90d supply, fill #0
  Filled 2023-05-30 (×2): qty 180, 90d supply, fill #1

## 2023-01-30 NOTE — Progress Notes (Signed)
Patient ID: Jason Flowers, male   DOB: 1970/11/05, 52 y.o.   MRN: [id number]   Jason Flowers, is a 52 y.o. male  [id number]  [id number]  DOB - 1971/02/06  No chief complaint on file.      Subjective:   Jason Flowers is a 52 y.o. male here today for wanting a cancer check up.  +FH of multiple kinds of CA.  Has not gotten colonoscopy(has kept avoiding/putting off).  No new issues or concerns himself.  He is fasting.  He wants testosterone and PSA checked.  His dad died of pancreatic CA.    No problems updated.  ALLERGIES: Allergies  Allergen Reactions   Shrimp Flavor [Flavoring Agent]     swelling    PAST MEDICAL HISTORY: Past Medical History:  Diagnosis Date   Seizure disorder (HCC)    Seizures (HCC)    since child     MEDICATIONS AT HOME: Prior to Admission medications   Medication Sig Start Date End Date Taking? Authorizing Provider  carbamazepine (TEGRETOL) 200 MG tablet TAKE 1 TABLET (200 MG TOTAL) BY MOUTH 2 (TWO) TIMES DAILY. 01/30/23 01/30/24  Anders Simmonds, PA-C  cetirizine (ZYRTEC) 10 MG tablet Take 1 tablet (10 mg total) by mouth daily. 01/30/23   Eboni Coval, Marzella Schlein, PA-C    ROS: Neg HEENT Neg resp Neg cardiac Neg GI Neg GU Neg MS Neg psych Neg neuro  Objective:   Vitals:   01/30/23 1426  BP: 124/85  Pulse: 76  SpO2: 98%  Weight: 216 lb (98 kg)   Exam General appearance : Awake, alert, not in any distress. Speech Clear. Not toxic looking HEENT: Atraumatic and Normocephalic Neck: Supple, no JVD. No cervical lymphadenopathy.  Chest: Good air entry bilaterally, CTAB.  No rales/rhonchi/wheezing CVS: S1 S2 regular, no murmurs.  Extremities: B/L Lower Ext shows no edema, both legs are warm to touch Neurology: Awake alert, and oriented X 3, CN II-XII intact, Non focal Skin: No Rash  Data Review Lab Results  Component Value Date   HGBA1C 5.7 (H) 03/14/2022   HGBA1C 5.5 (A) 12/08/2018    Assessment & Plan   1. Seizure disorder  (HCC) Stable-no seizures in a while - PSA - carbamazepine (TEGRETOL) 200 MG tablet; TAKE 1 TABLET (200 MG TOTAL) BY MOUTH 2 (TWO) TIMES DAILY.  Dispense: 180 tablet; Refill: 1  2. Post-nasal drip - cetirizine (ZYRTEC) 10 MG tablet; Take 1 tablet (10 mg total) by mouth daily.  Dispense: 30 tablet; Refill: 1  3. Low testosterone Did not follow up with urology - Testosterone,Free and Total - PSA  4. Screening for colon cancer - Ambulatory referral to Gastroenterology  5. Screening PSA (prostate specific antigen) - PSA  6. Elevated LFTs Does not drink - CMP14+EGFR  7. Family history of malignant neoplasm of pancreas - Lipase  6 months for CPE with Dr Alvis Lemmings    The patient was given clear instructions to go to ER or return to medical center if symptoms don't improve, worsen or new problems develop. The patient verbalized understanding. The patient was told to call to get lab results if they haven't heard anything in the next week.      Georgian Co, PA-C [facility] [place], [place] [phone]   01/30/2023, 2:48 PM

## 2023-02-01 ENCOUNTER — Telehealth: Payer: Self-pay

## 2023-02-01 ENCOUNTER — Other Ambulatory Visit: Payer: Self-pay | Admitting: Physician Assistant

## 2023-02-01 DIAGNOSIS — R7989 Other specified abnormal findings of blood chemistry: Secondary | ICD-10-CM

## 2023-02-01 LAB — CMP14+EGFR
ALT: 16 IU/L (ref 0–44)
AST: 22 IU/L (ref 0–40)
Albumin: 4.2 g/dL (ref 3.8–4.9)
Alkaline Phosphatase: 92 IU/L (ref 44–121)
BUN/Creatinine Ratio: 8 — ABNORMAL LOW (ref 9–20)
BUN: 11 mg/dL (ref 6–24)
Bilirubin Total: 0.4 mg/dL (ref 0.0–1.2)
CO2: 24 mmol/L (ref 20–29)
Calcium: 9.5 mg/dL (ref 8.7–10.2)
Chloride: 100 mmol/L (ref 96–106)
Creatinine, Ser: 1.42 mg/dL — ABNORMAL HIGH (ref 0.76–1.27)
Globulin, Total: 3.1 g/dL (ref 1.5–4.5)
Glucose: 84 mg/dL (ref 70–99)
Potassium: 4.6 mmol/L (ref 3.5–5.2)
Sodium: 136 mmol/L (ref 134–144)
Total Protein: 7.3 g/dL (ref 6.0–8.5)
eGFR: 59 mL/min/{1.73_m2} — ABNORMAL LOW (ref >59)

## 2023-02-01 LAB — TESTOSTERONE,FREE AND TOTAL
Testosterone, Free: 16.5 pg/mL (ref 7.2–24.0)
Testosterone: >1500 ng/dL — ABNORMAL HIGH (ref 264–916)

## 2023-02-01 LAB — LIPASE: Lipase: 26 U/L (ref 13–78)

## 2023-02-01 LAB — PSA: Prostate Specific Ag, Serum: 1 ng/mL (ref 0.0–4.0)

## 2023-02-01 NOTE — Telephone Encounter (Signed)
-----   Message from Georgian Co sent at 02/01/2023 11:06 AM EDT ----- Please call patient.  His testosterone is VERY elevated.  This is not unhealthy and can cause a lot of organ dysfunction and many side effects. If he is taking any type of steroids or testosterone supplement, he needs to stop them.  I am referring him to an endocrinologist and I want him to take any and all supplements/proteins, etc to the appt with him.  Pancreatic enzyme was normal.  Prostate level was normal.  Kidney function is a little impaired but improved some.  Please drink at least 80 ounces water daily.

## 2023-02-01 NOTE — Telephone Encounter (Signed)
Pt was called and is aware of results, DOB was confirmed.  ?

## 2023-02-11 ENCOUNTER — Encounter: Payer: Self-pay | Admitting: Gastroenterology

## 2023-04-08 ENCOUNTER — Other Ambulatory Visit: Payer: Self-pay

## 2023-04-08 ENCOUNTER — Ambulatory Visit (AMBULATORY_SURGERY_CENTER): Payer: Medicaid Other

## 2023-04-08 VITALS — Ht 66.0 in | Wt 214.0 lb

## 2023-04-08 DIAGNOSIS — Z1211 Encounter for screening for malignant neoplasm of colon: Secondary | ICD-10-CM

## 2023-04-08 MED ORDER — PLENVU 140 G PO SOLR
1.0000 | Freq: Once | ORAL | 0 refills | Status: AC
Start: 2023-04-08 — End: 2023-04-19
  Filled 2023-04-08 – 2023-04-18 (×2): qty 3, 1d supply, fill #0

## 2023-04-08 NOTE — Progress Notes (Signed)
Pre visit completed over telephone Instructions mailed to patient. Advised patient to call if he starts any new medications, has a seizure, or has an ER visit prior to colonoscopy.   No egg or soy allergy known to patient  No issues known to pt with past sedation with any surgeries or procedures Patient denies ever being told they had issues or difficulty with intubation  No FH of Malignant Hyperthermia Pt is not on diet pills Pt is not on  home 02  Pt is not on blood thinners  Pt denies issues with constipation  No A fib or A flutter Have any cardiac testing pending--no Pt instructed to use [email or web address].[email or web address] or GoodRx for a price reduction on prep

## 2023-04-14 ENCOUNTER — Encounter: Payer: Self-pay | Admitting: Certified Registered Nurse Anesthetist

## 2023-04-17 ENCOUNTER — Other Ambulatory Visit: Payer: Self-pay

## 2023-04-18 ENCOUNTER — Telehealth: Payer: Self-pay | Admitting: Gastroenterology

## 2023-04-18 ENCOUNTER — Other Ambulatory Visit: Payer: Self-pay

## 2023-04-18 NOTE — Telephone Encounter (Signed)
PT is calling to have Plenvu sent to [facility]. He needs it done today because he has no car and this is his only time to get a ride around 1pm. Please advise.

## 2023-04-22 ENCOUNTER — Ambulatory Visit: Payer: Medicaid Other | Admitting: Gastroenterology

## 2023-04-22 ENCOUNTER — Encounter: Payer: Self-pay | Admitting: Gastroenterology

## 2023-04-22 VITALS — BP 108/78 | HR 77 | Temp 97.4°F | Resp 19 | Ht 66.0 in | Wt 214.0 lb

## 2023-04-22 DIAGNOSIS — K635 Polyp of colon: Secondary | ICD-10-CM

## 2023-04-22 DIAGNOSIS — K648 Other hemorrhoids: Secondary | ICD-10-CM | POA: Diagnosis not present

## 2023-04-22 DIAGNOSIS — D128 Benign neoplasm of rectum: Secondary | ICD-10-CM

## 2023-04-22 DIAGNOSIS — Z1211 Encounter for screening for malignant neoplasm of colon: Secondary | ICD-10-CM | POA: Diagnosis not present

## 2023-04-22 DIAGNOSIS — K621 Rectal polyp: Secondary | ICD-10-CM | POA: Diagnosis not present

## 2023-04-22 MED ORDER — SODIUM CHLORIDE 0.9 % IV SOLN
500.0000 mL | Freq: Once | INTRAVENOUS | Status: DC
Start: 2023-04-22 — End: 2023-04-22

## 2023-04-22 NOTE — Progress Notes (Signed)
Pt de-sating with sats running 85 to 90, albutreol neg given.  vss

## 2023-04-22 NOTE — Progress Notes (Signed)
Report given to PACU, vss 

## 2023-04-22 NOTE — Progress Notes (Signed)
Pt's states no medical or surgical changes since previsit or office visit. 

## 2023-04-22 NOTE — Patient Instructions (Signed)
Resume all of your previous medications today.  Read all of the handouts given to you by your recovery room nurse.  YOU HAD AN ENDOSCOPIC PROCEDURE TODAY AT THE [facility]:   Refer to the procedure report that was given to you for any specific questions about what was found during the examination.  If the procedure report does not answer your questions, please call your gastroenterologist to clarify.  If you requested that your care partner not be given the details of your procedure findings, then the procedure report has been included in a sealed envelope for you to review at your convenience later.  YOU SHOULD EXPECT: Some feelings of bloating in the abdomen. Passage of more gas than usual.  Walking can help get rid of the air that was put into your GI tract during the procedure and reduce the bloating. If you had a lower endoscopy (such as a colonoscopy or flexible sigmoidoscopy) you may notice spotting of blood in your stool or on the toilet paper. If you underwent a bowel prep for your procedure, you may not have a normal bowel movement for a few days.  Please Note:  You might notice some irritation and congestion in your nose or some drainage.  This is from the oxygen used during your procedure.  There is no need for concern and it should clear up in a day or so.  SYMPTOMS TO REPORT IMMEDIATELY:  Following lower endoscopy (colonoscopy or flexible sigmoidoscopy):  Excessive amounts of blood in the stool  Significant tenderness or worsening of abdominal pains  Swelling of the abdomen that is new, acute  Fever of 100F or higher   For urgent or emergent issues, a gastroenterologist can be reached at any hour by calling [phone]. Do not use MyChart messaging for urgent concerns.    DIET:  We do recommend a small meal at first, but then you may proceed to your regular diet.  Drink plenty of fluids but you should avoid alcoholic beverages for 24 hours. Drink plenty of fluid  to prevent dehydration.  ACTIVITY:  You should plan to take it easy for the rest of today and you should NOT DRIVE or use heavy machinery until tomorrow (because of the sedation medicines used during the test).    FOLLOW UP: Our staff will call the number listed on your records the next business day following your procedure.  We will call around 7:15- 8:00 am to check on you and address any questions or concerns that you may have regarding the information given to you following your procedure. If we do not reach you, we will leave a message.     If any biopsies were taken you will be contacted by phone or by letter within the next 1-3 weeks.  Please call us at [phone] if you have not heard about the biopsies in 3 weeks.    SIGNATURES/CONFIDENTIALITY: You and/or your care partner have signed paperwork which will be entered into your electronic medical record.  These signatures attest to the fact that that the information above on your After Visit Summary has been reviewed and is understood.  Full responsibility of the confidentiality of this discharge information lies with you and/or your care-partner.

## 2023-04-22 NOTE — Op Note (Signed)
[facility] Patient Name: Jason Flowers Procedure Date: 04/22/2023 10:39 AM MRN: [id number] Endoscopist: Viviann Spare P. Adela Lank , MD, [id number] Age: 52 Referring MD:  Date of Birth: 04/28/1971 Gender: Male Account #: [id number] Procedure:                Colonoscopy Indications:              Screening for colorectal malignant neoplasm, This                            is the patient's first colonoscopy Medicines:                Monitored Anesthesia Care Procedure:                Pre-Anesthesia Assessment:                           - Prior to the procedure, a History and Physical                            was performed, and patient medications and                            allergies were reviewed. The patient's tolerance of                            previous anesthesia was also reviewed. The risks                            and benefits of the procedure and the sedation                            options and risks were discussed with the patient.                            All questions were answered, and informed consent                            was obtained. Prior Anticoagulants: The patient has                            taken no anticoagulant or antiplatelet agents. ASA                            Grade Assessment: III - A patient with severe                            systemic disease. After reviewing the risks and                            benefits, the patient was deemed in satisfactory                            condition to undergo the procedure.  After obtaining informed consent, the colonoscope                            was passed under direct vision. Throughout the                            procedure, the patient's blood pressure, pulse, and                            oxygen saturations were monitored continuously. The                            CF [id number] #[id number] was introduced through the anus                            and  advanced to the the cecum, identified by                            appendiceal orifice and ileocecal valve. The                            colonoscopy was performed without difficulty. The                            patient tolerated the procedure well. The quality                            of the bowel preparation was good. The ileocecal                            valve, appendiceal orifice, and rectum were                            photographed. Scope In: 10:42:38 AM Scope Out: 11:01:16 AM Scope Withdrawal Time: 0 hours 15 minutes 18 seconds  Total Procedure Duration: 0 hours 18 minutes 38 seconds  Findings:                 The perianal and digital rectal examinations were                            normal.                           A diminutive polyp was found in the rectum. The                            polyp was sessile. The polyp was removed with a                            cold biopsy forceps. Resection and retrieval were                            complete.  Internal hemorrhoids were found during                            retroflexion. The hemorrhoids were small.                           The exam was otherwise without abnormality. Complications:            No immediate complications. Estimated blood loss:                            Minimal. Estimated Blood Loss:     Estimated blood loss was minimal. Impression:               - One diminutive polyp in the rectum, removed with                            a cold biopsy forceps. Resected and retrieved.                           - Internal hemorrhoids.                           - The examination was otherwise normal. Recommendation:           - Patient has a contact number available for                            emergencies. The signs and symptoms of potential                            delayed complications were discussed with the                            patient. Return to normal activities  tomorrow.                            Written discharge instructions were provided to the                            patient.                           - Resume previous diet.                           - Continue present medications.                           - Await pathology results. Viviann Spare P. Renesha Lizama, MD 04/22/2023 11:04:14 AM This report has been signed electronically.

## 2023-04-22 NOTE — Progress Notes (Signed)
Called to room to assist during endoscopic procedure.  Patient ID and intended procedure confirmed with present staff. Received instructions for my participation in the procedure from the performing physician.  

## 2023-04-22 NOTE — Progress Notes (Signed)
1055 Patient experiencing nausea and vomiting.  MD updated and Zofran 4 mg IV given, vss

## 2023-04-22 NOTE — Progress Notes (Signed)
[facility] History and Physical   Primary Care Physician:  Hoy Register, MD   Reason for Procedure:   Colon cancer screening  Plan:    colonoscopy     HPI: Jason Flowers is a 52 y.o. male  here for colonoscopy screening  - first time exam.   Patient denies any bowel symptoms at this time. 2 aunts with colon cancer known. Otherwise feels well without any cardiopulmonary symptoms.   I have discussed risks / benefits of anesthesia and endoscopic procedure with Jason Flowers and they wish to proceed with the exams as outlined today.    Past Medical History:  Diagnosis Date   Seizure disorder (HCC)    since childhood, last seizure 2021-2022    History reviewed. No pertinent surgical history.  Prior to Admission medications   Medication Sig Start Date End Date Taking? Authorizing Provider  carbamazepine (TEGRETOL) 200 MG tablet Take 1 tablet (200 mg total) by mouth 2 (two) times daily. 01/30/23  Yes Anders Simmonds, PA-C  cetirizine (ZYRTEC) 10 MG tablet Take 1 tablet (10 mg total) by mouth daily. Patient not taking: Reported on 04/22/2023 01/30/23   Anders Simmonds, PA-C    Current Outpatient Medications  Medication Sig Dispense Refill   carbamazepine (TEGRETOL) 200 MG tablet Take 1 tablet (200 mg total) by mouth 2 (two) times daily. 180 tablet 1   cetirizine (ZYRTEC) 10 MG tablet Take 1 tablet (10 mg total) by mouth daily. (Patient not taking: Reported on 04/22/2023) 30 tablet 1   Current Facility-Administered Medications  Medication Dose Route Frequency Provider Last Rate Last Admin   0.9 %  sodium chloride infusion  500 mL Intravenous Once Rayni Nemitz, Willaim Rayas, MD        Allergies as of 04/22/2023 - Review Complete 04/22/2023  Allergen Reaction Noted   Shrimp flavor [flavoring agent]  06/23/2020    Family History  Problem Relation Age of Onset   Coronary artery disease Mother    Cancer Father        pancreatic   Cancer Sister    Seizures Sister     Colon cancer Maternal Aunt    Seizures Son     Social History   Socioeconomic History   Marital status: Single    Spouse name: Not on file   Number of children: Not on file   Years of education: Not on file   Highest education level: Not on file  Occupational History   Not on file  Tobacco Use   Smoking status: Never   Smokeless tobacco: Never  Vaping Use   Vaping status: Never Used  Substance and Sexual Activity   Alcohol use: No    Alcohol/week: 0.0 standard drinks of alcohol   Drug use: No   Sexual activity: Not on file  Other Topics Concern   Not on file  Social History Narrative   Not on file   Social Determinants of Health   Financial Resource Strain: Not on file  Food Insecurity: Not on file  Transportation Needs: Not on file  Physical Activity: Not on file  Stress: Not on file  Social Connections: Not on file  Intimate Partner Violence: Not on file    Review of Systems: All other review of systems negative except as mentioned in the HPI.  Physical Exam: Vital signs BP 126/69   Pulse 97   Temp (!) 97.4 F (36.3 C) (Skin)   Ht 5\' 6"  (1.676 m)   Wt 214 lb (97.1 kg)  SpO2 98%   BMI 34.54 kg/m   General:   Alert,  Well-developed, pleasant and cooperative in NAD Lungs:  Clear throughout to auscultation.   Heart:  Regular rate and rhythm Abdomen:  Soft, nontender and nondistended.   Neuro/Psych:  Alert and cooperative. Normal mood and affect. A and O x 3  Harlin Rain, MD [facility]

## 2023-04-23 ENCOUNTER — Telehealth: Payer: Self-pay | Admitting: *Deleted
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
## 2023-04-24 ENCOUNTER — Encounter: Payer: Self-pay | Admitting: Gastroenterology

## 2023-04-24 LAB — SURGICAL PATHOLOGY

## 2023-05-30 ENCOUNTER — Other Ambulatory Visit: Payer: Self-pay

## 2023-06-06 ENCOUNTER — Other Ambulatory Visit: Payer: Self-pay

## 2023-10-17 ENCOUNTER — Ambulatory Visit: Attending: Family Medicine | Admitting: Family Medicine

## 2023-10-17 ENCOUNTER — Encounter: Payer: Self-pay | Admitting: Family Medicine

## 2023-10-17 VITALS — BP 119/74 | HR 68 | Ht 66.0 in | Wt 212.2 lb

## 2023-10-17 DIAGNOSIS — Z809 Family history of malignant neoplasm, unspecified: Secondary | ICD-10-CM

## 2023-10-17 DIAGNOSIS — G40909 Epilepsy, unspecified, not intractable, without status epilepticus: Secondary | ICD-10-CM

## 2023-10-17 DIAGNOSIS — Z125 Encounter for screening for malignant neoplasm of prostate: Secondary | ICD-10-CM

## 2023-10-17 DIAGNOSIS — R7303 Prediabetes: Secondary | ICD-10-CM

## 2023-10-17 DIAGNOSIS — Z0001 Encounter for general adult medical examination with abnormal findings: Secondary | ICD-10-CM

## 2023-10-17 NOTE — Progress Notes (Signed)
 Subjective:  Patient ID: Jason Flowers, male    DOB: 11/04/1970  Age: 53 y.o. MRN: [id number]  CC: Annual Exam (Discuss family history of cancer/)     Discussed the use of AI scribe software for clinical note transcription with the patient, who gave verbal consent to proceed.  History of Present Illness Jason Flowers is a 53 year old male who presents for an annual physical exam.  He is concerned about cancer risk due to a significant family history, including his father's death from pancreatic cancer and his sister's history of breast and rare cancer of the right ear. He underwent a colonoscopy last November, which revealed a hyperplastic polyp without dysplasia. His next colonoscopy is scheduled in ten years.  He takes Tegretol  for seizures without issues and was noted to have prediabetes in 2023 with an A1c of 5.7. He experiences occasional hunger pains but no other abdominal pain. He works as a Administrator, arts, eats well after 12 PM, and includes fruits and vegetables in his diet.    Past Medical History:  Diagnosis Date   Seizure disorder (HCC)    since childhood, last seizure 2021-2022    No past surgical history on file.  Family History  Problem Relation Age of Onset   Coronary artery disease Mother    Cancer Father        pancreatic   Cancer Sister    Seizures Sister    Colon cancer Maternal Aunt    Seizures Son     Social History   Socioeconomic History   Marital status: Single    Spouse name: Not on file   Number of children: Not on file   Years of education: Not on file   Highest education level: Not on file  Occupational History   Not on file  Tobacco Use   Smoking status: Never   Smokeless tobacco: Never  Vaping Use   Vaping status: Never Used  Substance and Sexual Activity   Alcohol use: No    Alcohol/week: 0.0 standard drinks of alcohol   Drug use: No   Sexual activity: Not on file  Other Topics Concern   Not on file  Social  History Narrative   Not on file   Social Drivers of Health   Financial Resource Strain: Not on file  Food Insecurity: Not on file  Transportation Needs: Not on file  Physical Activity: Not on file  Stress: Not on file  Social Connections: Not on file    Allergies  Allergen Reactions   Shrimp Flavor [Flavoring Agent (Non-Screening)]     swelling    Outpatient Medications Prior to Visit  Medication Sig Dispense Refill   carbamazepine  (TEGRETOL ) 200 MG tablet Take 1 tablet (200 mg total) by mouth 2 (two) times daily. 180 tablet 1   cetirizine  (ZYRTEC ) 10 MG tablet Take 1 tablet (10 mg total) by mouth daily. 30 tablet 1   No facility-administered medications prior to visit.     ROS Review of Systems  Constitutional:  Negative for activity change and appetite change.  HENT:  Negative for sinus pressure and sore throat.   Respiratory:  Negative for chest tightness, shortness of breath and wheezing.   Cardiovascular:  Negative for chest pain and palpitations.  Gastrointestinal:  Negative for abdominal distention, abdominal pain and constipation.  Genitourinary: Negative.   Musculoskeletal: Negative.   Psychiatric/Behavioral:  Negative for behavioral problems and dysphoric mood.     Objective:  BP 119/74   Pulse 68  Ht 5\' 6"  (1.676 m)   Wt 212 lb 3.2 oz (96.3 kg)   SpO2 97%   BMI 34.25 kg/m      10/17/2023   10:17 AM 04/22/2023   11:30 AM 04/22/2023   11:13 AM  BP/Weight  Systolic BP 119 108 107  Diastolic BP 74 78 77  Wt. (Lbs) 212.2    BMI 34.25 kg/m2        Physical Exam Constitutional:      Appearance: He is well-developed.  HENT:     Head: Normocephalic and atraumatic.     Right Ear: External ear normal.     Left Ear: External ear normal.  Eyes:     Conjunctiva/sclera: Conjunctivae normal.     Pupils: Pupils are equal, round, and reactive to light.  Neck:     Trachea: No tracheal deviation.  Cardiovascular:     Rate and Rhythm: Normal rate and  regular rhythm.     Heart sounds: Normal heart sounds. No murmur heard. Pulmonary:     Effort: Pulmonary effort is normal. No respiratory distress.     Breath sounds: Normal breath sounds. No wheezing.  Chest:     Chest wall: No tenderness.  Abdominal:     General: Bowel sounds are normal.     Palpations: Abdomen is soft. There is no mass.     Tenderness: There is no abdominal tenderness.  Musculoskeletal:        General: No tenderness. Normal range of motion.     Cervical back: Normal range of motion and neck supple.  Skin:    General: Skin is warm and dry.  Neurological:     Mental Status: He is alert and oriented to person, place, and time.        Latest Ref Rng & Units 01/30/2023    3:05 PM 10/10/2022    4:45 AM 03/14/2022   10:01 AM  CMP  Glucose 70 - 99 mg/dL 84  811  86   BUN 6 - 24 mg/dL 11  20  14    Creatinine 0.76 - 1.27 mg/dL 9.14  7.82  9.56   Sodium 134 - 144 mmol/L 136  139  142   Potassium 3.5 - 5.2 mmol/L 4.6  3.7  4.2   Chloride 96 - 106 mmol/L 100  105  104   CO2 20 - 29 mmol/L 24  24  24    Calcium 8.7 - 10.2 mg/dL 9.5  8.7  9.2   Total Protein 6.0 - 8.5 g/dL 7.3  6.8  7.2   Total Bilirubin 0.0 - 1.2 mg/dL 0.4  0.3  0.2   Alkaline Phos 44 - 121 IU/L 92  80  87   AST 0 - 40 IU/L 22  37  31   ALT 0 - 44 IU/L 16  27  24      Lipid Panel     Component Value Date/Time   CHOL 150 09/14/2021 1217   TRIG 42 09/14/2021 1217   HDL 52 09/14/2021 1217   CHOLHDL 2.7 09/22/2019 1019   CHOLHDL 2.6 05/10/2016 0905   VLDL 10 05/10/2016 0905   LDLCALC 89 09/14/2021 1217    CBC    Component Value Date/Time   WBC 5.5 10/10/2022 0445   RBC 4.30 10/10/2022 0445   HGB 13.0 10/10/2022 0445   HGB 16.0 09/14/2021 1217   HCT 38.3 (L) 10/10/2022 0445   HCT 49.0 09/14/2021 1217   PLT 193 10/10/2022 0445   PLT 264 09/14/2021  1217   MCV 89.1 10/10/2022 0445   MCV 92 09/14/2021 1217   MCH 30.2 10/10/2022 0445   MCHC 33.9 10/10/2022 0445   RDW 12.6 10/10/2022 0445    RDW 13.4 09/14/2021 1217   LYMPHSABS 1.3 10/10/2022 0445   LYMPHSABS 1.4 09/14/2021 1217   MONOABS 0.8 10/10/2022 0445   EOSABS 0.5 10/10/2022 0445   EOSABS 0.2 09/14/2021 1217   BASOSABS 0.0 10/10/2022 0445   BASOSABS 0.0 09/14/2021 1217    Lab Results  Component Value Date   HGBA1C 5.7 (H) 03/14/2022      1. Annual visit for general adult medical examination with abnormal findings (Primary) Counseled on 150 minutes of exercise per week, healthy eating (including decreased daily intake of saturated fats, cholesterol, added sugars, sodium), routine healthcare maintenance. - LP+Non-HDL Cholesterol - CMP14+EGFR - CBC with Differential/Platelet  2. Screening for prostate cancer - PSA, total and free  3. Seizure disorder (HCC) Stable Continue Tegretol  - Carbamazepine  level, total  4. Prediabetes Last A1c was 5.7 Will check again today Continue to work on lifestyle modification to prevent progression to type 2 diabetes mellitus - Hemoglobin A1c  5. Family history of cancer Family history of pancreatic cancer in dad and in sister history of breast cancer and rare ear cancer - Ambulatory referral to Genetics - Cancer Antigen 19-9   No orders of the defined types were placed in this encounter.   Follow-up: Return in about 6 months (around 04/18/2024) for Chronic medical conditions.       Joaquin Mulberry, MD, FAAFP. [facility] [place], [place] [phone]   10/17/2023, 12:23 PM

## 2023-10-17 NOTE — Patient Instructions (Signed)
 Health Maintenance, Male  Adopting a healthy lifestyle and getting preventive care are important in promoting health and wellness. Ask your health care provider about:  The right schedule for you to have regular tests and exams.  Things you can do on your own to prevent diseases and keep yourself healthy.  What should I know about diet, weight, and exercise?  Eat a healthy diet    Eat a diet that includes plenty of vegetables, fruits, low-fat dairy products, and lean protein.  Do not eat a lot of foods that are high in solid fats, added sugars, or sodium.  Maintain a healthy weight  Body mass index (BMI) is a measurement that can be used to identify possible weight problems. It estimates body fat based on height and weight. Your health care provider can help determine your BMI and help you achieve or maintain a healthy weight.  Get regular exercise  Get regular exercise. This is one of the most important things you can do for your health. Most adults should:  Exercise for at least 150 minutes each week. The exercise should increase your heart rate and make you sweat (moderate-intensity exercise).  Do strengthening exercises at least twice a week. This is in addition to the moderate-intensity exercise.  Spend less time sitting. Even light physical activity can be beneficial.  Watch cholesterol and blood lipids  Have your blood tested for lipids and cholesterol at 54 years of age, then have this test every 5 years.  You may need to have your cholesterol levels checked more often if:  Your lipid or cholesterol levels are high.  You are older than 54 years of age.  You are at high risk for heart disease.  What should I know about cancer screening?  Many types of cancers can be detected early and may often be prevented. Depending on your health history and family history, you may need to have cancer screening at various ages. This may include screening for:  Colorectal cancer.  Prostate cancer.  Skin cancer.  Lung  cancer.  What should I know about heart disease, diabetes, and high blood pressure?  Blood pressure and heart disease  High blood pressure causes heart disease and increases the risk of stroke. This is more likely to develop in people who have high blood pressure readings or are overweight.  Talk with your health care provider about your target blood pressure readings.  Have your blood pressure checked:  Every 3-5 years if you are 9-95 years of age.  Every year if you are 85 years old or older.  If you are between the ages of 29 and 29 and are a current or former smoker, ask your health care provider if you should have a one-time screening for abdominal aortic aneurysm (AAA).  Diabetes  Have regular diabetes screenings. This checks your fasting blood sugar level. Have the screening done:  Once every three years after age 23 if you are at a normal weight and have a low risk for diabetes.  More often and at a younger age if you are overweight or have a high risk for diabetes.  What should I know about preventing infection?  Hepatitis B  If you have a higher risk for hepatitis B, you should be screened for this virus. Talk with your health care provider to find out if you are at risk for hepatitis B infection.  Hepatitis C  Blood testing is recommended for:  Everyone born from [birth year] through 1966.  Anyone  with known risk factors for hepatitis C.  Sexually transmitted infections (STIs)  You should be screened each year for STIs, including gonorrhea and chlamydia, if:  You are sexually active and are younger than 54 years of age.  You are older than 54 years of age and your health care provider tells you that you are at risk for this type of infection.  Your sexual activity has changed since you were last screened, and you are at increased risk for chlamydia or gonorrhea. Ask your health care provider if you are at risk.  Ask your health care provider about whether you are at high risk for HIV. Your health care provider  may recommend a prescription medicine to help prevent HIV infection. If you choose to take medicine to prevent HIV, you should first get tested for HIV. You should then be tested every 3 months for as long as you are taking the medicine.  Follow these instructions at home:  Alcohol use  Do not drink alcohol if your health care provider tells you not to drink.  If you drink alcohol:  Limit how much you have to 0-2 drinks a day.  Know how much alcohol is in your drink. In the [place] one drink equals one 12 oz bottle of beer (355 mL), one 5 oz glass of wine (148 mL), or one 1 oz glass of hard liquor (44 mL).  Lifestyle  Do not use any products that contain nicotine or tobacco. These products include cigarettes, chewing tobacco, and vaping devices, such as e-cigarettes. If you need help quitting, ask your health care provider.  Do not use street drugs.  Do not share needles.  Ask your health care provider for help if you need support or information about quitting drugs.  General instructions  Schedule regular health, dental, and eye exams.  Stay current with your vaccines.  Tell your health care provider if:  You often feel depressed.  You have ever been abused or do not feel safe at home.  Summary  Adopting a healthy lifestyle and getting preventive care are important in promoting health and wellness.  Follow your health care provider's instructions about healthy diet, exercising, and getting tested or screened for diseases.  Follow your health care provider's instructions on monitoring your cholesterol and blood pressure.  This information is not intended to replace advice given to you by your health care provider. Make sure you discuss any questions you have with your health care provider.  Document Revised: 10/03/2020 Document Reviewed: 10/03/2020  Elsevier Patient Education  2024 [employer].

## 2023-10-18 ENCOUNTER — Ambulatory Visit: Payer: Self-pay | Admitting: Family Medicine

## 2023-10-18 LAB — CMP14+EGFR
ALT: 35 IU/L (ref 0–44)
AST: 41 IU/L — ABNORMAL HIGH (ref 0–40)
Albumin: 4.5 g/dL (ref 3.8–4.9)
Alkaline Phosphatase: 94 IU/L (ref 44–121)
BUN/Creatinine Ratio: 10 (ref 9–20)
BUN: 13 mg/dL (ref 6–24)
Bilirubin Total: 0.5 mg/dL (ref 0.0–1.2)
CO2: 20 mmol/L (ref 20–29)
Calcium: 9.5 mg/dL (ref 8.7–10.2)
Chloride: 102 mmol/L (ref 96–106)
Creatinine, Ser: 1.33 mg/dL — ABNORMAL HIGH (ref 0.76–1.27)
Globulin, Total: 2.7 g/dL (ref 1.5–4.5)
Glucose: 84 mg/dL (ref 70–99)
Potassium: 4.6 mmol/L (ref 3.5–5.2)
Sodium: 139 mmol/L (ref 134–144)
Total Protein: 7.2 g/dL (ref 6.0–8.5)
eGFR: 64 mL/min/{1.73_m2} (ref >59)

## 2023-10-18 LAB — PSA, TOTAL AND FREE
PSA, Free Pct: 26.7 %
PSA, Free: 0.24 ng/mL
Prostate Specific Ag, Serum: 0.9 ng/mL (ref 0.0–4.0)

## 2023-10-18 LAB — CBC WITH DIFFERENTIAL/PLATELET
Basophils Absolute: 0 10*3/uL (ref 0.0–0.2)
Basos: 1 %
EOS (ABSOLUTE): 0.4 10*3/uL (ref 0.0–0.4)
Eos: 6 %
Hematocrit: 44.8 % (ref 37.5–51.0)
Hemoglobin: 14.6 g/dL (ref 13.0–17.7)
Immature Grans (Abs): 0 10*3/uL (ref 0.0–0.1)
Immature Granulocytes: 0 %
Lymphocytes Absolute: 1.8 10*3/uL (ref 0.7–3.1)
Lymphs: 29 %
MCH: 29.9 pg (ref 26.6–33.0)
MCHC: 32.6 g/dL (ref 31.5–35.7)
MCV: 92 fL (ref 79–97)
Monocytes Absolute: 0.8 10*3/uL (ref 0.1–0.9)
Monocytes: 14 %
Neutrophils Absolute: 3 10*3/uL (ref 1.4–7.0)
Neutrophils: 50 %
Platelets: 250 10*3/uL (ref 150–450)
RBC: 4.89 x10E6/uL (ref 4.14–5.80)
RDW: 15 % (ref 11.6–15.4)
WBC: 6 10*3/uL (ref 3.4–10.8)

## 2023-10-18 LAB — HEMOGLOBIN A1C
Est. average glucose Bld gHb Est-mCnc: 114 mg/dL
Hgb A1c MFr Bld: 5.6 % (ref 4.8–5.6)

## 2023-10-18 LAB — LP+NON-HDL CHOLESTEROL
Cholesterol, Total: 174 mg/dL (ref 100–199)
HDL: 55 mg/dL (ref >39)
LDL Chol Calc (NIH): 108 mg/dL — ABNORMAL HIGH (ref 0–99)
Total Non-HDL-Chol (LDL+VLDL): 119 mg/dL (ref 0–129)
Triglycerides: 59 mg/dL (ref 0–149)
VLDL Cholesterol Cal: 11 mg/dL (ref 5–40)

## 2023-10-18 LAB — CANCER ANTIGEN 19-9: CA 19-9: 15 U/mL (ref 0–35)

## 2023-10-18 LAB — CARBAMAZEPINE LEVEL, TOTAL: Carbamazepine (Tegretol), S: 5.7 ug/mL (ref 4.0–12.0)

## 2023-10-30 ENCOUNTER — Other Ambulatory Visit: Payer: Self-pay | Admitting: Family Medicine

## 2023-10-30 ENCOUNTER — Other Ambulatory Visit: Payer: Self-pay

## 2023-10-30 DIAGNOSIS — G40909 Epilepsy, unspecified, not intractable, without status epilepticus: Secondary | ICD-10-CM

## 2023-10-30 MED ORDER — CARBAMAZEPINE 200 MG PO TABS
200.0000 mg | ORAL_TABLET | Freq: Two times a day (BID) | ORAL | 1 refills | Status: DC
  Filled 2023-10-30: qty 180, 90d supply, fill #0
  Filled 2024-02-04: qty 180, 90d supply, fill #1

## 2023-10-31 ENCOUNTER — Other Ambulatory Visit: Payer: Self-pay

## 2023-12-27 ENCOUNTER — Inpatient Hospital Stay

## 2023-12-27 ENCOUNTER — Inpatient Hospital Stay: Attending: Genetic Counselor | Admitting: Genetic Counselor

## 2023-12-27 ENCOUNTER — Encounter: Payer: Self-pay | Admitting: Genetic Counselor

## 2023-12-27 DIAGNOSIS — Z803 Family history of malignant neoplasm of breast: Secondary | ICD-10-CM

## 2023-12-27 DIAGNOSIS — Z1379 Encounter for other screening for genetic and chromosomal anomalies: Secondary | ICD-10-CM | POA: Insufficient documentation

## 2023-12-27 DIAGNOSIS — Z800 Family history of malignant neoplasm of digestive organs: Secondary | ICD-10-CM | POA: Diagnosis not present

## 2023-12-27 LAB — GENETIC SCREENING ORDER

## 2023-12-27 NOTE — Progress Notes (Signed)
 REFERRING PROVIDER: Delbert Clam, MD [address] [place] [address] [place],  [place] [address]  PRIMARY PROVIDER:  Delbert Clam, MD  PRIMARY REASON FOR VISIT:  1. Family history of malignant neoplasm of gastrointestinal tract   2. Family history of malignant neoplasm of breast      HISTORY OF PRESENT ILLNESS:   Jason Flowers, a 53 y.Flowers. male, was seen for a [facility] cancer genetics consultation at the request of Dr. Newlin due to a family history of cancer.  Jason Flowers presents to clinic today to discuss the possibility of a hereditary predisposition to cancer, to discuss genetic testing, and to further clarify his future cancer risks, as well as potential cancer risks for family members.   Jason Flowers is a 53 y.Flowers. male with no personal history of cancer.    RELEVANT MEDICAL HISTORY:  Colonoscopy 03/2023, one diminutive polyp, 10 year follow up recommended  Normal PSA 2025 Normal CA19-9 2025  Past Medical History:  Diagnosis Date   Seizure disorder (HCC)    since childhood, last seizure 2021-2022    No past surgical history on file.  Social History   Socioeconomic History   Marital status: Single    Spouse name: Not on file   Number of children: Not on file   Years of education: Not on file   Highest education level: Not on file  Occupational History   Not on file  Tobacco Use   Smoking status: Never   Smokeless tobacco: Never  Vaping Use   Vaping status: Never Used  Substance and Sexual Activity   Alcohol use: No    Alcohol/week: 0.0 standard drinks of alcohol   Drug use: No   Sexual activity: Not on file  Other Topics Concern   Not on file  Social History Narrative   Not on file   Social Drivers of Health   Financial Resource Strain: Not on file  Food Insecurity: Not on file  Transportation Needs: Not on file  Physical Activity: Not on file  Stress: Not on file  Social Connections: Not on file     FAMILY HISTORY:  We obtained a detailed,  4-generation family history.  Significant diagnoses are listed below: Family History  Problem Relation Age of Onset   Coronary artery disease Mother    Pancreatic cancer Father 98   Cancer Sister 55       rare cancer behind ear   Seizures Sister    Breast cancer Sister 18   Cancer Maternal Aunt 29 - 39       GI cancer   Seizures Son     Jason Flowers is unaware of previous family history of genetic testing for hereditary cancer risks. There is no reported Ashkenazi Jewish ancestry.     GENETIC COUNSELING ASSESSMENT: Jason Flowers is a 53 y.Flowers. male with a family history of cancer which is somewhat suggestive of a hereditary predisposition to cancer given the family history of a first degree relative with breast cancer under 50 and a first degree relative with pancreatic cancer. We, therefore, discussed and recommended the following at today's visit.   DISCUSSION: We discussed that 5 - 10% of cancer is hereditary, with most cases of hereditary breast and pancreatic cancer associated with BRCA1/2 pathogenic variants.  There are other genes that can be associated with hereditary breast and pancreatic cancer syndromes.  We discussed that testing is beneficial for several reasons, including knowing about other cancer risks, identifying potential screening and risk-reduction options that  may be appropriate, and to understanding if other family members could be at risk for cancer and allowing them to undergo genetic testing.  We reviewed the characteristics, features and inheritance patterns of hereditary cancer syndromes. We also discussed genetic testing, including the appropriate family members to test, the process of testing, insurance coverage and turn-around-time for results. We discussed the implications of a negative, positive, carrier and/or variant of uncertain significant result. We discussed that negative results would be uninformative given that Jason Flowers does not have a personal history  of cancer. We recommended Jason Flowers pursue genetic testing for a panel that contains genes associated with breast, pancreatic and gastrointestinal cancers.  Jason Flowers was offered a common hereditary cancer panel (40 genes) and an expanded pan-cancer panel (77 genes). Jason Flowers was informed of the benefits and limitations of each panel, including that expanded pan-cancer panels contain several genes that do not have clear management guidelines at this point in time.  We also discussed that as the number of genes included on a panel increases, the chances of variants of uncertain significance increases.  After considering the benefits and limitations of each gene panel, Jason Flowers elected to have [facility] CancerNext-Expanded +RNAinsight panel. The CancerNext-Expanded gene panel offered by [facility] and includes sequencing, rearrangement, and RNA analysis for the following 77 genes: AIP, ALK, APC, ATM, AXIN2, BAP1, BARD1, BMPR1A, BRCA1, BRCA2, BRIP1, CDC73, CDH1, CDK4, CDKN1B, CDKN2A, CEBPA, CHEK2, CTNNA1, DDX41, DICER1, EGFR, EPCAM, ETV6, FH, FLCN, GATA2, GREM1, HOXB13, KIT, LZTR1, MAX, MBD4, MEN1, MET, MITF, MLH1, MSH2, MSH3, MSH6, MUTYH, NF1, NF2, NTHL1, PALB2, PDGFRA, PHOX2B, PMS2, POLD1, POLE, POT1, PRKAR1A, PTCH1, PTEN, RAD51C, RAD51D, RB1, RET, RPS20, RUNX1, SDHA, SDHAF2, SDHB, SDHC, SDHD, SMAD4, SMARCA4, SMARCB1, SMARCE1, STK11, SUFU, TMEM127, TP53, TSC1, TSC2, VHL, WT1.    Based on Jason Flowers's family history of cancer, he meets medical criteria for genetic testing. Though Jason Flowers is not personally affected, there are no affected family members that are willing/able to undergo hereditary cancer testing. Despite that he meets criteria, he may still have an out of pocket cost. We discussed that if his out of pocket cost for testing is over $100, the laboratory should contact them to discuss self-pay prices, patient pay assistance programs, if applicable, and other billing options.  We  discussed that some people do not want to undergo genetic testing due to fear of genetic discrimination.  A federal law called the Genetic Information Non-Discrimination Act (GINA) of 2008 helps protect individuals against genetic discrimination based on their genetic test results.  It impacts both health insurance and employment.  With health insurance, it protects against increased premiums, being kicked off insurance or being forced to take a test in order to be insured.  For employment it protects against hiring, firing and promoting decisions based on genetic test results.  GINA does not apply to those in the [employer], those who work for companies with less than 15 employees, and new life insurance or long-term disability insurance policies.  Health status due to a cancer diagnosis is not protected under GINA.  PLAN: After considering the risks, benefits, and limitations, Jason Flowers provided informed consent to pursue genetic testing and the blood sample was sent to [facility] for analysis of the CancerNext-Expanded +RNAinsight test. Results should be available within approximately 2-3 weeks' time, at which point they will be disclosed by telephone to Jason Flowers, as will any additional recommendations warranted by these results. Jason Flowers will receive a summary of his  genetic counseling visit and a copy of his results once available. This information will also be available in Epic.   Lastly, we encouraged Jason Flowers to remain in contact with cancer genetics annually so that we can continuously update the family history and inform him of any changes in cancer genetics and testing that may be of benefit for this family.   Jason Flowers questions were answered to his satisfaction today. Our contact information was provided should additional questions or concerns arise. Thank you for the referral and allowing us  to share in the care of your patient.   Jason Ogren, MS,  [facility] Licensed, Retail [email or web address] phone: [phone]   50 minutes were spent on the date of the encounter in service to the patient including preparation, face-to-face consultation, documentation and care coordination.  The patient was seen alone.  Drs. Gudena and/or Lanny were available to discuss this case as needed.  _______________________________________________________________________ For Office Staff:  Number of people involved in session: 1 Was an Intern/ student involved with case: no

## 2024-01-06 ENCOUNTER — Telehealth: Payer: Self-pay | Admitting: Genetic Counselor

## 2024-01-06 ENCOUNTER — Ambulatory Visit: Payer: Self-pay | Admitting: Genetic Counselor

## 2024-01-06 DIAGNOSIS — Z1379 Encounter for other screening for genetic and chromosomal anomalies: Secondary | ICD-10-CM | POA: Insufficient documentation

## 2024-01-06 NOTE — Telephone Encounter (Signed)
 I spoke to Jason Flowers to review results of genetic testing. he had genetic testing with [facility] CancerNext-Expanded +RNAinsight panel. Testing did not identify any variants known to increase the risk for cancer.  Discussed that we do not know why  there is cancer in the family. It could be due to a different gene that we are not testing, or maybe our current technology may not be able to pick something up.  It will be important for him to keep in contact with genetics to keep up with whether additional testing may be needed.  Please see counseling note for further detail on this result.

## 2024-01-06 NOTE — Progress Notes (Signed)
 HPI:  Mr. Bramer was previously seen in the [facility] due to a family history of cancer and concerns regarding a hereditary predisposition to cancer. Please refer to our prior cancer genetics clinic note for more information regarding our discussion, assessment and recommendations, at the time. Mr. Bohorquez recent genetic test results were disclosed to him, as were recommendations warranted by these results. These results and recommendations are discussed in more detail below.  Results disclosed to Mr. Olarte by phone on 01/06/24.   FAMILY HISTORY:  We obtained a detailed, 4-generation family history.  Significant diagnoses are listed below: Family History  Problem Relation Age of Onset   Coronary artery disease Mother    Pancreatic cancer Father 37   Cancer Sister 56       rare cancer behind ear   Seizures Sister    Breast cancer Sister 9   Cancer Maternal Aunt 90 - 40       GI cancer   Seizures Son     Mr. Welchel is unaware of previous family history of genetic testing for hereditary cancer risks. There is no reported Ashkenazi Jewish ancestry.      GENETIC TEST RESULTS: Genetic testing reported out on 01/03/24 through the [facility] CancerNext-Expanded +RNAinsight panel found no pathogenic mutations. The CancerNext-Expanded gene panel offered by [facility] and includes sequencing, rearrangement, and RNA analysis for the following 77 genes: AIP, ALK, APC, ATM, AXIN2, BAP1, BARD1, BMPR1A, BRCA1, BRCA2, BRIP1, CDC73, CDH1, CDK4, CDKN1B, CDKN2A, CEBPA, CHEK2, CTNNA1, DDX41, DICER1, EGFR, EPCAM, ETV6, FH, FLCN, GATA2, GREM1, HOXB13, KIT, LZTR1, MAX, MBD4, MEN1, MET, MITF, MLH1, MSH2, MSH3, MSH6, MUTYH, NF1, NF2, NTHL1, PALB2, PDGFRA, PHOX2B, PMS2, POLD1, POLE, POT1, PRKAR1A, PTCH1, PTEN, RAD51C, RAD51D, RB1, RET, RPS20, RUNX1, SDHA, SDHAF2, SDHB, SDHC, SDHD, SMAD4, SMARCA4, SMARCB1, SMARCE1, STK11, SUFU, TMEM127, TP53, TSC1, TSC2, VHL, WT1. The test report has been  scanned into EPIC and is located under the Molecular Pathology section of the Results Review tab.  A portion of the result report is included below for reference.     We discussed with Mr. Landress that because current genetic testing is not perfect, it is possible there may be a gene mutation in one of these genes that current testing cannot detect, but that chance is small.  We also discussed, that there could be another gene that has not yet been discovered, or that we have not yet tested, that is responsible for the cancer diagnoses in the family. It is also possible there is a hereditary cause for the cancer in the family that Mr. Raygoza did not inherit and therefore was not identified in his testing.  Therefore, it is important to remain in touch with cancer genetics in the future so that we can continue to offer Mr. Nack the most up to date genetic testing.   ADDITIONAL GENETIC TESTING: We discussed with Mr. Combes that his genetic testing was fairly extensive.  If there are genes identified to increase cancer risk that can be analyzed in the future, we would be happy to discuss and coordinate this testing at that time.    CANCER SCREENING RECOMMENDATIONS: Mr. Salek test result is considered negative (normal).  This means that we have not identified a hereditary cause for his family history of cancer at this time. Most cancers happen by chance and this negative test suggests that his family history of cancer may fall into this category.    Possible reasons for Mr. Hostler's negative genetic test  include:  1. There may be a gene mutation in one of these genes that current testing methods cannot detect but that chance is small.  2. There could be another gene that has not yet been discovered, or that we have not yet tested, that is responsible for the cancer diagnoses in the family.  3.  There may be no hereditary risk for cancer in the family. The cancers in Mr. Ambrosino and/or his  family may be sporadic/familial or due to other genetic and environmental factors. 4. It is also possible there is a hereditary cause for the cancer in the family that Mr. Sommerville did not inherit.  Therefore, it is recommended he continue to follow the cancer management and screening guidelines provided by his primary healthcare provider. An individual's cancer risk and medical management are not determined by genetic test results alone. Overall cancer risk assessment incorporates additional factors, including personal medical history, family history, and any available genetic information that may result in a personalized plan for cancer prevention and surveillance  Given Mr. Ewen's family histories, we must interpret these negative results with some caution.  Families with features suggestive of hereditary risk for cancer tend to have multiple family members with cancer, diagnoses in multiple generations and diagnoses before the age of 34. Mr. Mierzwa family exhibits some of these features. Thus, this result may simply reflect our current inability to detect all mutations within these genes or there may be a different gene that has not yet been discovered or tested.   An individual's cancer risk and medical management are not determined by genetic test results alone. Overall cancer risk assessment incorporates additional factors, including personal medical history, family history, and any available genetic information that may result in a personalized plan for cancer prevention and surveillance.  RECOMMENDATIONS FOR FAMILY MEMBERS:  Individuals in this family might be at some increased risk of developing cancer, over the general population risk, simply due to the family history of cancer.  We recommended women in this family have a yearly mammogram beginning at age 33, or 68 years younger than the earliest onset of cancer, an annual clinical breast exam, and perform monthly breast self-exams. Women  in this family should also have a gynecological exam as recommended by their primary provider. All family members should be referred for colonoscopy starting at age 37, or 73 years younger than the earliest onset of cancer.   It is also possible there is a hereditary cause for the cancer in Mr. Westhoff's family that he did not inherit and therefore was not identified in him.  Based on Mr. Primm's family history, we recommended his sister, who was diagnosed with breast at age 17, have genetic counseling and testing. Mr. Mcadam will let us  know if we can be of any assistance in coordinating genetic counseling and/or testing for this family member.   FOLLOW-UP: Lastly, we discussed with Mr. Stanke that cancer genetics is a rapidly advancing field and it is possible that new genetic tests will be appropriate for him and/or his family members in the future. We encouraged him to remain in contact with cancer genetics on an annual basis so we can update his personal and family histories and let him know of advances in cancer genetics that may benefit this family.   Our contact number was provided. Mr. Senne questions were answered to his satisfaction, and he knows he is welcome to call us  at anytime with additional questions or concerns.   Burnard Ogren, MS,  [facility] Licensed, Retail [email or web address]

## 2024-02-04 ENCOUNTER — Other Ambulatory Visit: Payer: Self-pay

## 2024-02-10 ENCOUNTER — Other Ambulatory Visit: Payer: Self-pay

## 2024-02-25 ENCOUNTER — Ambulatory Visit (HOSPITAL_COMMUNITY)
Admission: EM | Admit: 2024-02-25 | Discharge: 2024-02-25 | Disposition: A | Discharge status: Home / Self Care | Attending: Emergency Medicine | Admitting: Emergency Medicine

## 2024-02-25 DIAGNOSIS — R0981 Nasal congestion: Secondary | ICD-10-CM | POA: Diagnosis not present

## 2024-02-25 DIAGNOSIS — B349 Viral infection, unspecified: Secondary | ICD-10-CM | POA: Diagnosis not present

## 2024-02-25 LAB — POC SARS CORONAVIRUS 2 AG -  ED: SARS Coronavirus 2 Ag: NEGATIVE

## 2024-02-25 NOTE — ED Provider Notes (Signed)
 [facility]    CSN: [id number] Arrival date & time: 02/25/24  0854      History   Chief Complaint Chief Complaint  Patient presents with   Nasal Congestion    HPI Jason Flowers is a 53 y.o. male.   Patient presents to clinic over concern of sinus headache, nasal congestion and rhinorrhea for the past 3 days.  Having taken over-the-counter treatment.  Does feel better today.  Reports feeling cold yesterday, has not had documented fever, has not checked temperature.  Denies cough, wheezing or shortness of breath.  Reports his son is prone to hospitalization, allergies and asthma exacerbations.  Patient wanted to make sure he does not have COVID-19, so he does not spread it to his son.  The history is provided by the patient and medical records.    Past Medical History:  Diagnosis Date   Seizure disorder (HCC)    since childhood, last seizure 2021-2022    Patient Active Problem List   Diagnosis Date Noted   Genetic testing 01/06/2024   Family history of prostate cancer in father 05/09/2017   Obesity (BMI 30.0-34.9) 08/06/2016   Chronic kidney disease 03/12/2016   Seizure disorder (HCC) 08/05/2014    No past surgical history on file.     Home Medications    Prior to Admission medications   Medication Sig Start Date End Date Taking? Authorizing Provider  carbamazepine  (TEGRETOL ) 200 MG tablet Take 1 tablet (200 mg total) by mouth 2 (two) times daily. 10/30/23  Yes Newlin, Enobong, MD  cetirizine  (ZYRTEC ) 10 MG tablet Take 1 tablet (10 mg total) by mouth daily. 01/30/23  Yes Danton Jon HERO, PA-C    Family History Family History  Problem Relation Age of Onset   Coronary artery disease Mother    Pancreatic cancer Father 110   Cancer Sister 39       rare cancer behind ear   Seizures Sister    Breast cancer Sister 47   Cancer Maternal Aunt 78 - 59       GI cancer   Seizures Son     Social History Social History   Tobacco Use   Smoking status:  Never   Smokeless tobacco: Never  Vaping Use   Vaping status: Never Used  Substance Use Topics   Alcohol use: No    Alcohol/week: 0.0 standard drinks of alcohol   Drug use: No     Allergies   Shrimp flavor [flavoring agent (non-screening)]   Review of Systems Review of Systems  Per HPI  Physical Exam Triage Vital Signs ED Triage Vitals  Encounter Vitals Group     BP 02/25/24 0924 120/66     Girls Systolic BP Percentile --      Girls Diastolic BP Percentile --      Boys Systolic BP Percentile --      Boys Diastolic BP Percentile --      Pulse Rate 02/25/24 0924 77     Resp 02/25/24 0924 18     Temp 02/25/24 0924 98.1 F (36.7 C)     Temp Source 02/25/24 0924 Oral     SpO2 02/25/24 0924 98 %     Weight --      Height --      Head Circumference --      Peak Flow --      Pain Score 02/25/24 0928 0     Pain Loc --      Pain Education --  Exclude from Growth Chart --    No data found.  Updated Vital Signs BP 120/66 (BP Location: Left Arm)   Pulse 77   Temp 98.1 F (36.7 C) (Oral)   Resp 18   SpO2 98%   Visual Acuity Right Eye Distance:   Left Eye Distance:   Bilateral Distance:    Right Eye Near:   Left Eye Near:    Bilateral Near:     Physical Exam Vitals and nursing note reviewed.  Constitutional:      Appearance: Normal appearance.  HENT:     Head: Normocephalic and atraumatic.     Right Ear: External ear normal.     Left Ear: External ear normal.     Nose: Congestion and rhinorrhea present.     Mouth/Throat:     Mouth: Mucous membranes are moist.     Pharynx: Posterior oropharyngeal erythema present.  Eyes:     Conjunctiva/sclera: Conjunctivae normal.  Cardiovascular:     Rate and Rhythm: Normal rate and regular rhythm.     Heart sounds: Normal heart sounds. No murmur heard. Pulmonary:     Effort: Pulmonary effort is normal. No respiratory distress.     Breath sounds: Normal breath sounds. No wheezing.  Skin:    General: Skin is  warm and dry.  Neurological:     General: No focal deficit present.     Mental Status: He is alert and oriented to person, place, and time.  Psychiatric:        Mood and Affect: Mood normal.        Behavior: Behavior normal.      UC Treatments / Results  Labs (all labs ordered are listed, but only abnormal results are displayed) Labs Reviewed  POC SARS CORONAVIRUS 2 AG -  ED    EKG   Radiology No results found.  Procedures Procedures (including critical care time)  Medications Ordered in UC Medications - No data to display  Initial Impression / Assessment and Plan / UC Course  I have reviewed the triage vital signs and the nursing notes.  Pertinent labs & imaging results that were available during my care of the patient were reviewed by me and considered in my medical decision making (see chart for details).  Vitals and triage reviewed, patient is hemodynamically stable.  Lungs vesicular, heart with regular rate and rhythm.  Congestion, rhinorrhea and postnasal drip present, acute symptoms consistent with viral URI.  POC COVID testing negative.  Symptomatic management for viral illness discussed.  Plan of care, follow-up care return precautions given, no questions at this time.    Final Clinical Impressions(s) / UC Diagnoses   Final diagnoses:  Nasal congestion  Viral illness     Discharge Instructions      COVID testing was negative, you most likely have a different viral illness.  You can alternate between 600 mg of ibuprofen and 500 mg of Tylenol  every 4-6 hours for any headache, body aches or chills.  Over-the-counter Flonase nasal spray, 2 sprays to both nostrils twice daily can help with congestion.  Viral illnesses typically last 5 to 7 days in duration.  If you develop any new concerning symptoms or prolonged symptoms please follow-up with your primary care provider or return to clinic for reevaluation.     ED Prescriptions   None    PDMP not  reviewed this encounter.   Dreama, Anastasios Melander  N, FNP 02/25/24 1019

## 2024-02-25 NOTE — ED Triage Notes (Signed)
 Patient presents to the office requesting a covid testing. Patient states he had headache and congestion 3 days ago. Patient states he feels better now.

## 2024-02-25 NOTE — Discharge Instructions (Signed)
 COVID testing was negative, you most likely have a different viral illness.  You can alternate between 600 mg of ibuprofen and 500 mg of Tylenol  every 4-6 hours for any headache, body aches or chills.  Over-the-counter Flonase nasal spray, 2 sprays to both nostrils twice daily can help with congestion.  Viral illnesses typically last 5 to 7 days in duration.  If you develop any new concerning symptoms or prolonged symptoms please follow-up with your primary care provider or return to clinic for reevaluation.

## 2024-04-20 ENCOUNTER — Ambulatory Visit: Admitting: Family Medicine

## 2024-05-25 ENCOUNTER — Other Ambulatory Visit: Payer: Self-pay

## 2024-05-25 ENCOUNTER — Other Ambulatory Visit: Payer: Self-pay | Admitting: Family Medicine

## 2024-05-25 DIAGNOSIS — G40909 Epilepsy, unspecified, not intractable, without status epilepticus: Secondary | ICD-10-CM

## 2024-05-25 MED ORDER — CARBAMAZEPINE 200 MG PO TABS
200.0000 mg | ORAL_TABLET | Freq: Two times a day (BID) | ORAL | 0 refills | Status: DC
  Filled 2024-05-25: qty 60, 30d supply, fill #0

## 2024-06-03 ENCOUNTER — Other Ambulatory Visit: Payer: Self-pay

## 2024-06-04 ENCOUNTER — Other Ambulatory Visit: Payer: Self-pay

## 2024-06-10 ENCOUNTER — Telehealth: Payer: Self-pay | Admitting: Family Medicine

## 2024-06-10 NOTE — Telephone Encounter (Signed)
 Contacted pt to confirmed appt (per vr)

## 2024-06-11 ENCOUNTER — Ambulatory Visit: Attending: Family Medicine | Admitting: Family Medicine

## 2024-06-11 ENCOUNTER — Other Ambulatory Visit: Payer: Self-pay

## 2024-06-11 ENCOUNTER — Encounter: Payer: Self-pay | Admitting: Family Medicine

## 2024-06-11 VITALS — BP 126/74 | HR 80 | Temp 97.8°F | Ht 66.0 in | Wt 227.2 lb

## 2024-06-11 DIAGNOSIS — G40909 Epilepsy, unspecified, not intractable, without status epilepticus: Secondary | ICD-10-CM

## 2024-06-11 DIAGNOSIS — Z23 Encounter for immunization: Secondary | ICD-10-CM

## 2024-06-11 DIAGNOSIS — J302 Other seasonal allergic rhinitis: Secondary | ICD-10-CM | POA: Diagnosis not present

## 2024-06-11 DIAGNOSIS — Z13228 Encounter for screening for other metabolic disorders: Secondary | ICD-10-CM

## 2024-06-11 MED ORDER — CETIRIZINE HCL 10 MG PO TABS
10.0000 mg | ORAL_TABLET | Freq: Every day | ORAL | 1 refills | Status: AC
  Filled 2024-06-11: qty 90, 90d supply, fill #0

## 2024-06-11 MED ORDER — CARBAMAZEPINE 200 MG PO TABS
200.0000 mg | ORAL_TABLET | Freq: Two times a day (BID) | ORAL | 1 refills | Status: AC
  Filled 2024-06-11: qty 180, 90d supply, fill #0

## 2024-06-11 NOTE — Patient Instructions (Signed)
 VISIT SUMMARY:  Jason Flowers, you had a follow-up visit today to discuss your hypertension, seizure disorder, and persistent cough. You also received routine health maintenance including vaccinations.  YOUR PLAN:  -SEIZURE DISORDER: Your seizure disorder is well-controlled with no recent seizures. Continue taking Carbamazepine  (Tegretol ) 200 MG twice daily. We have ordered a Tegretol  level and blood work to check your cholesterol, kidney, and liver function.  -POSTNASAL DRIP: Your chronic postnasal drip with cough is likely related to allergies. Continue taking Cetirizine  (Zyrtec ) 10 MG daily. Your prescription for Cetirizine  has been refilled.  -GENERAL HEALTH MAINTENANCE: We discussed routine health maintenance. Your recent genetic testing for cancer risk was negative, and your PSA and colonoscopy results were normal. You received a flu shot and a pneumonia shot today.  INSTRUCTIONS:  Please follow up with the lab to complete your Tegretol  level and blood work. Continue taking your medications as prescribed. If you experience any new symptoms or have concerns, please schedule an appointment.

## 2024-06-11 NOTE — Progress Notes (Signed)
 "  Subjective:  Patient ID: Jason Flowers, male    DOB: January 22, 1971  Age: 54 y.o. MRN: [id number]  CC: Medical Management of Chronic Issues (allergies)     Discussed the use of AI scribe software for clinical note transcription with the patient, who gave verbal consent to proceed.  History of Present Illness Jason Flowers is a 54 year old male with hypertension, stage III CKD and seizure disorder who presents for a follow-up visit.  He notes elevated blood pressure today, which he attributes to drinking coffee, but he feels well without associated symptoms.  He has a persistent productive cough that he attributes to allergies. He takes Zyrtec  at night and has no wheezing or shortness of breath. He needs a Zyrtec  refill.  His seizure disorder is managed with Tegretol . He has had no recent seizures. He is due for a Tegretol  level and cholesterol check, with his last blood work done in May.  He previously had genetic testing for cancer predisposition due to a family history of cancer in his sister and father and recalls being told he is not a carrier.  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  He has had a normal colonoscopy in the past and a normal PSA checked in May.    Past Medical History:  Diagnosis Date   Seizure disorder (HCC)    since childhood, last seizure 2021-2022    No past surgical history on file.  Family History  Problem Relation Age of Onset   Coronary artery disease Mother    Pancreatic cancer Father 57   Cancer Sister 3       rare cancer behind ear   Seizures Sister    Breast cancer Sister 55   Cancer Maternal Aunt 2 - 86       GI cancer   Seizures Son     Social History   Socioeconomic History   Marital status: Single    Spouse name: Not on file   Number of children: Not on file   Years of education: Not on file   Highest education level: Not on file  Occupational History   Not on file  Tobacco Use    Smoking status: Never   Smokeless tobacco: Never  Vaping Use   Vaping status: Never Used  Substance and Sexual Activity   Alcohol use: No    Alcohol/week: 0.0 standard drinks of alcohol   Drug use: No   Sexual activity: Not on file  Other Topics Concern   Not on file  Social History Narrative   Not on file   Social Drivers of Health   Tobacco Use: Low Risk (06/11/2024)   Patient History    Smoking Tobacco Use: Never    Smokeless Tobacco Use: Never    Passive Exposure: Not on file  Financial Resource Strain: Not on file  Food Insecurity: Not on file  Transportation Needs: Not on file  Physical Activity: Not on file  Stress: Not on file  Social Connections: Not on file  Depression (PHQ2-9): Low Risk (10/17/2023)   Depression (PHQ2-9)    PHQ-2 Score: 2  Alcohol Screen: Not on file  Housing: Not on file  Utilities: Not on file  Health Literacy: Not on file    Allergies[1]  Outpatient Medications Prior to Visit  Medication Sig Dispense Refill   carbamazepine  (TEGRETOL ) 200 MG tablet Take 1 tablet (200 mg total) by mouth 2 (two) times daily.Please keep upcoming  appt for additional refills. 60 tablet 0   cetirizine  (ZYRTEC ) 10 MG tablet Take 1 tablet (10 mg total) by mouth daily. 30 tablet 1   No facility-administered medications prior to visit.     ROS Review of Systems  Constitutional:  Negative for activity change and appetite change.  HENT:  Negative for sinus pressure and sore throat.   Respiratory:  Positive for cough. Negative for chest tightness, shortness of breath and wheezing.   Cardiovascular:  Negative for chest pain and palpitations.  Gastrointestinal:  Negative for abdominal distention, abdominal pain and constipation.  Genitourinary: Negative.   Musculoskeletal: Negative.   Psychiatric/Behavioral:  Negative for behavioral problems and dysphoric mood.     Objective:  BP 126/74   Pulse 80   Temp 97.8 F (36.6 C) (Oral)   Ht 5' 6 (1.676 m)   Wt  227 lb 3.2 oz (103.1 kg)   SpO2 96%   BMI 36.67 kg/m      06/11/2024   10:51 AM 06/11/2024   10:23 AM 02/25/2024    9:24 AM  BP/Weight  Systolic BP 126 146 120  Diastolic BP 74 77 66  Wt. (Lbs)  227.2   BMI  36.67 kg/m2       Physical Exam Constitutional:      Appearance: He is well-developed.  Cardiovascular:     Rate and Rhythm: Normal rate.     Heart sounds: Normal heart sounds. No murmur heard. Pulmonary:     Effort: Pulmonary effort is normal.     Breath sounds: Normal breath sounds. No wheezing or rales.  Chest:     Chest wall: No tenderness.  Abdominal:     General: Bowel sounds are normal. There is no distension.     Palpations: Abdomen is soft. There is no mass.     Tenderness: There is no abdominal tenderness.  Musculoskeletal:        General: Normal range of motion.     Right lower leg: No edema.     Left lower leg: No edema.  Neurological:     Mental Status: He is alert and oriented to person, place, and time.  Psychiatric:        Mood and Affect: Mood normal.        Latest Ref Rng & Units 06/11/2024   11:15 AM 10/17/2023   11:09 AM 01/30/2023    3:05 PM  CMP  Glucose 70 - 99 mg/dL 82  84  84   BUN 6 - 24 mg/dL 11  13  11    Creatinine 0.76 - 1.27 mg/dL 8.70  8.66  8.57   Sodium 134 - 144 mmol/L 140  139  136   Potassium 3.5 - 5.2 mmol/L 4.4  4.6  4.6   Chloride 96 - 106 mmol/L 101  102  100   CO2 20 - 29 mmol/L 23  20  24    Calcium 8.7 - 10.2 mg/dL 9.4  9.5  9.5   Total Protein 6.0 - 8.5 g/dL 7.6  7.2  7.3   Total Bilirubin 0.0 - 1.2 mg/dL 0.6  0.5  0.4   Alkaline Phos 47 - 123 IU/L 91  94  92   AST 0 - 40 IU/L 39  41  22   ALT 0 - 44 IU/L 23  35  16     Lipid Panel     Component Value Date/Time   CHOL 188 06/11/2024 1115   TRIG 66 06/11/2024 1115   HDL  43 06/11/2024 1115   CHOLHDL 2.7 09/22/2019 1019   CHOLHDL 2.6 05/10/2016 0905   VLDL 10 05/10/2016 0905   LDLCALC 133 (H) 06/11/2024 1115    CBC    Component Value Date/Time   WBC  6.0 10/17/2023 1109   WBC 5.5 10/10/2022 0445   RBC 4.89 10/17/2023 1109   RBC 4.30 10/10/2022 0445   HGB 14.6 10/17/2023 1109   HCT 44.8 10/17/2023 1109   PLT 250 10/17/2023 1109   MCV 92 10/17/2023 1109   MCH 29.9 10/17/2023 1109   MCH 30.2 10/10/2022 0445   MCHC 32.6 10/17/2023 1109   MCHC 33.9 10/10/2022 0445   RDW 15.0 10/17/2023 1109   LYMPHSABS 1.8 10/17/2023 1109   MONOABS 0.8 10/10/2022 0445   EOSABS 0.4 10/17/2023 1109   BASOSABS 0.0 10/17/2023 1109    Lab Results  Component Value Date   HGBA1C 5.6 10/17/2023       Assessment & Plan Seizure disorder Well-controlled with no recent seizures. - Continue Carbamazepine  (Tegretol ) 200 MG oral bid. - Ordered Tegretol  level. - Ordered blood work including cholesterol, kidney, and liver function tests.  Seasonal she is Chronic postnasal drip with cough, likely allergy-related. - Continue Cetirizine  (Zyrtec ) 10 MG oral daily. - Refilled Cetirizine  (Zyrtec ).  General Health Maintenance Routine health maintenance discussed. Recent genetic testing for cancer risk negative. PSA and colonoscopy normal. Discussed flu and pneumonia vaccinations. Encounter for vaccine administration- Administered flu shot. - Administered pneumonia shot. Screening for metabolic disorder-labs ordered      Meds ordered this encounter  Medications   carbamazepine  (TEGRETOL ) 200 MG tablet    Sig: Take 1 tablet (200 mg total) by mouth 2 (two) times daily.Please keep upcoming appt for additional refills.    Dispense:  180 tablet    Refill:  1   cetirizine  (ZYRTEC ) 10 MG tablet    Sig: Take 1 tablet (10 mg total) by mouth daily.    Dispense:  90 tablet    Refill:  1    Follow-up: Return in about 6 months (around 12/09/2024) for Chronic medical conditions.       Corrina Sabin, MD, FAAFP. [facility] [place], [place] [phone]   06/12/2024, 10:50 AM     [1]  Allergies Allergen Reactions    Shrimp Flavor [Flavoring Agent (Non-Screening)]     swelling   "

## 2024-06-12 ENCOUNTER — Ambulatory Visit: Payer: Self-pay | Admitting: Family Medicine

## 2024-06-12 ENCOUNTER — Encounter: Payer: Self-pay | Admitting: Family Medicine

## 2024-06-12 LAB — CMP14+EGFR
ALT: 23 IU/L (ref 0–44)
AST: 39 IU/L (ref 0–40)
Albumin: 4.4 g/dL (ref 3.8–4.9)
Alkaline Phosphatase: 91 IU/L (ref 47–123)
BUN/Creatinine Ratio: 9 (ref 9–20)
BUN: 11 mg/dL (ref 6–24)
Bilirubin Total: 0.6 mg/dL (ref 0.0–1.2)
CO2: 23 mmol/L (ref 20–29)
Calcium: 9.4 mg/dL (ref 8.7–10.2)
Chloride: 101 mmol/L (ref 96–106)
Creatinine, Ser: 1.29 mg/dL — ABNORMAL HIGH (ref 0.76–1.27)
Globulin, Total: 3.2 g/dL (ref 1.5–4.5)
Glucose: 82 mg/dL (ref 70–99)
Potassium: 4.4 mmol/L (ref 3.5–5.2)
Sodium: 140 mmol/L (ref 134–144)
Total Protein: 7.6 g/dL (ref 6.0–8.5)
eGFR: 66 mL/min/1.73

## 2024-06-12 LAB — LP+NON-HDL CHOLESTEROL
Cholesterol, Total: 188 mg/dL (ref 100–199)
HDL: 43 mg/dL
LDL Chol Calc (NIH): 133 mg/dL — ABNORMAL HIGH (ref 0–99)
Total Non-HDL-Chol (LDL+VLDL): 145 mg/dL — ABNORMAL HIGH (ref 0–129)
Triglycerides: 66 mg/dL (ref 0–149)
VLDL Cholesterol Cal: 12 mg/dL (ref 5–40)

## 2024-06-12 LAB — CARBAMAZEPINE LEVEL, TOTAL: Carbamazepine (Tegretol), S: 6.6 ug/mL (ref 4.0–12.0)

## 2024-06-24 ENCOUNTER — Other Ambulatory Visit: Payer: Self-pay

## 2024-12-09 ENCOUNTER — Ambulatory Visit: Payer: Self-pay | Admitting: Family Medicine
